# Patient Record
Sex: Female | Born: 1975 | Race: White | Hispanic: No | Marital: Married | Smoking: Never smoker
Health system: Southern US, Community
[De-identification: ages and names within clinical notes are randomized; demographics above are authoritative.]

## PROBLEM LIST (undated history)

## (undated) DIAGNOSIS — Z803 Family history of malignant neoplasm of breast: Secondary | ICD-10-CM

## (undated) DIAGNOSIS — Z1509 Genetic susceptibility to other malignant neoplasm: Secondary | ICD-10-CM

## (undated) DIAGNOSIS — Z1379 Encounter for other screening for genetic and chromosomal anomalies: Principal | ICD-10-CM

## (undated) DIAGNOSIS — Z8041 Family history of malignant neoplasm of ovary: Secondary | ICD-10-CM

## (undated) DIAGNOSIS — Z1501 Genetic susceptibility to malignant neoplasm of breast: Secondary | ICD-10-CM

## (undated) DIAGNOSIS — J189 Pneumonia, unspecified organism: Secondary | ICD-10-CM

## (undated) HISTORY — DX: Family history of malignant neoplasm of ovary: Z80.41

## (undated) HISTORY — DX: Encounter for other screening for genetic and chromosomal anomalies: Z13.79

## (undated) HISTORY — PX: BREAST SURGERY: SHX581

## (undated) HISTORY — PX: DILATION AND CURETTAGE OF UTERUS: SHX78

## (undated) HISTORY — DX: Genetic susceptibility to other malignant neoplasm: Z15.09

## (undated) HISTORY — DX: Genetic susceptibility to malignant neoplasm of breast: Z15.01

## (undated) HISTORY — DX: Family history of malignant neoplasm of breast: Z80.3

---

## 2013-04-10 ENCOUNTER — Encounter (HOSPITAL_COMMUNITY): Payer: Self-pay

## 2013-04-10 ENCOUNTER — Emergency Department (HOSPITAL_COMMUNITY)
Admission: EM | Admit: 2013-04-10 | Discharge: 2013-04-10 | Disposition: A | Discharge status: Home / Self Care | Payer: BC Managed Care – PPO | Attending: Emergency Medicine | Admitting: Emergency Medicine

## 2013-04-10 ENCOUNTER — Emergency Department (HOSPITAL_COMMUNITY): Payer: BC Managed Care – PPO

## 2013-04-10 DIAGNOSIS — S42023A Displaced fracture of shaft of unspecified clavicle, initial encounter for closed fracture: Secondary | ICD-10-CM | POA: Insufficient documentation

## 2013-04-10 DIAGNOSIS — Y929 Unspecified place or not applicable: Secondary | ICD-10-CM | POA: Insufficient documentation

## 2013-04-10 DIAGNOSIS — S42022A Displaced fracture of shaft of left clavicle, initial encounter for closed fracture: Secondary | ICD-10-CM

## 2013-04-10 DIAGNOSIS — Y9389 Activity, other specified: Secondary | ICD-10-CM | POA: Insufficient documentation

## 2013-04-10 DIAGNOSIS — R209 Unspecified disturbances of skin sensation: Secondary | ICD-10-CM | POA: Insufficient documentation

## 2013-04-10 MED ORDER — OXYCODONE-ACETAMINOPHEN 5-325 MG PO TABS: 1.0000 | ORAL_TABLET | Freq: Four times a day (QID) | ORAL | Status: DC | PRN

## 2013-04-10 MED ORDER — MORPHINE SULFATE 4 MG/ML IJ SOLN
4.0000 mg | Freq: Once | INTRAMUSCULAR | Status: AC
  Administered 2013-04-10: 4 mg via INTRAVENOUS
  Filled 2013-04-10: qty 1

## 2013-04-10 NOTE — ED Provider Notes (Signed)
Medical screening examination/treatment/procedure(s) were performed by non-physician practitioner and as supervising physician I was immediately available for consultation/collaboration.   Mirai Greenwood, MD 04/10/13 2204 
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
## 2013-04-10 NOTE — ED Notes (Signed)
Ortho called for sling placement. Ortho aware of pt.

## 2013-04-10 NOTE — ED Notes (Signed)
ems gave 250 mcgs of fentanyl in route

## 2013-06-06 ENCOUNTER — Ambulatory Visit
Admission: RE | Admit: 2013-06-06 | Discharge: 2013-06-06 | Disposition: A | Discharge status: Home / Self Care | Payer: BC Managed Care – PPO | Source: Ambulatory Visit | Attending: Orthopedic Surgery | Admitting: Orthopedic Surgery

## 2013-06-06 ENCOUNTER — Other Ambulatory Visit: Payer: Self-pay | Admitting: Orthopedic Surgery

## 2013-06-06 DIAGNOSIS — R52 Pain, unspecified: Secondary | ICD-10-CM

## 2013-06-06 DIAGNOSIS — R531 Weakness: Secondary | ICD-10-CM

## 2013-06-11 ENCOUNTER — Other Ambulatory Visit (HOSPITAL_COMMUNITY): Payer: Self-pay | Admitting: Orthopedic Surgery

## 2013-06-13 ENCOUNTER — Encounter (HOSPITAL_COMMUNITY): Payer: Self-pay | Admitting: Pharmacy Technician

## 2013-06-14 ENCOUNTER — Encounter (HOSPITAL_COMMUNITY): Payer: Self-pay

## 2013-06-14 ENCOUNTER — Other Ambulatory Visit (HOSPITAL_COMMUNITY): Payer: Self-pay | Admitting: *Deleted

## 2013-06-14 ENCOUNTER — Encounter (HOSPITAL_COMMUNITY)
Admission: RE | Admit: 2013-06-14 | Discharge: 2013-06-14 | Disposition: A | Discharge status: Home / Self Care | Payer: BC Managed Care – PPO | Source: Ambulatory Visit | Attending: Orthopedic Surgery | Admitting: Orthopedic Surgery

## 2013-06-14 DIAGNOSIS — Z01818 Encounter for other preprocedural examination: Secondary | ICD-10-CM | POA: Insufficient documentation

## 2013-06-14 DIAGNOSIS — Z01812 Encounter for preprocedural laboratory examination: Secondary | ICD-10-CM | POA: Insufficient documentation

## 2013-06-14 HISTORY — DX: Pneumonia, unspecified organism: J18.9

## 2013-06-14 LAB — HCG, SERUM, QUALITATIVE: Preg, Serum: NEGATIVE

## 2013-06-14 LAB — CBC
HCT: 37 % (ref 36.0–46.0)
Hemoglobin: 12.5 g/dL (ref 12.0–15.0)
MCH: 33.1 pg (ref 26.0–34.0)
MCHC: 33.8 g/dL (ref 30.0–36.0)
RBC: 3.78 MIL/uL — ABNORMAL LOW (ref 3.87–5.11)

## 2013-06-14 NOTE — Pre-Procedure Instructions (Signed)
Judith Preston  06/14/2013   Your procedure is scheduled on:  Tuesday, June 18, 2013 at 7:30 AM.   Report to [facility] Entrance "A" at 5:30 AM.   Call this number if you have problems the morning of surgery: [phone]   Remember:   Do not eat food or drink liquids after midnight Monday, 06/17/13.   Take these medicines the morning of surgery with A SIP OF WATER: oxyCODONE-acetaminophen (PERCOCET/ROXICET) - if needed, birth control pill  Stop Vitamins as of today, 06/14/13.    Do not wear jewelry, make-up or nail polish.  Do not wear lotions, powders, or perfumes. You may wear deodorant.  Do not shave 48 hours prior to surgery.   Do not bring valuables to the hospital.  [facility] is not responsible                  for any belongings or valuables.               Contacts, dentures or bridgework may not be worn into surgery.  Leave suitcase in the car. After surgery it may be brought to your room.  For patients admitted to the hospital, discharge time is determined by your                treatment team.               Patients discharged the day of surgery will not be allowed to drive  home.  Name and phone number of your driver: Family/friend   Special Instructions: Shower using CHG 2 nights before surgery and the night before surgery.  If you shower the day of surgery use CHG.  Use special wash - you have one bottle of CHG for all showers.  You should use approximately 1/3 of the bottle for each shower.   Please read over the following fact sheets that you were given: Pain Booklet, Coughing and Deep Breathing and Surgical Site Infection Prevention
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
## 2013-06-18 ENCOUNTER — Ambulatory Visit (HOSPITAL_COMMUNITY): Payer: BC Managed Care – PPO

## 2013-06-18 ENCOUNTER — Ambulatory Visit (HOSPITAL_COMMUNITY)
Admission: RE | Admit: 2013-06-18 | Discharge: 2013-06-18 | Disposition: A | Discharge status: Home / Self Care | Payer: BC Managed Care – PPO | Source: Ambulatory Visit | Attending: Orthopedic Surgery | Admitting: Orthopedic Surgery

## 2013-06-18 ENCOUNTER — Encounter (HOSPITAL_COMMUNITY): Payer: Self-pay | Admitting: *Deleted

## 2013-06-18 ENCOUNTER — Encounter (HOSPITAL_COMMUNITY)
Admission: RE | Disposition: A | Discharge status: Home / Self Care | Payer: Self-pay | Source: Ambulatory Visit | Attending: Orthopedic Surgery

## 2013-06-18 ENCOUNTER — Encounter (HOSPITAL_COMMUNITY): Payer: BC Managed Care – PPO | Admitting: Anesthesiology

## 2013-06-18 ENCOUNTER — Ambulatory Visit (HOSPITAL_COMMUNITY): Payer: BC Managed Care – PPO | Admitting: Anesthesiology

## 2013-06-18 DIAGNOSIS — S42002G Fracture of unspecified part of left clavicle, subsequent encounter for fracture with delayed healing: Secondary | ICD-10-CM

## 2013-06-18 DIAGNOSIS — IMO0002 Reserved for concepts with insufficient information to code with codable children: Secondary | ICD-10-CM | POA: Insufficient documentation

## 2013-06-18 HISTORY — PX: ORIF CLAVICULAR FRACTURE: SHX5055

## 2013-06-18 SURGERY — OPEN REDUCTION INTERNAL FIXATION (ORIF) CLAVICULAR FRACTURE
Anesthesia: General | Site: Shoulder | Laterality: Left | Wound class: Clean

## 2013-06-18 MED ORDER — METHOCARBAMOL 500 MG PO TABS: 500.0000 mg | ORAL_TABLET | Freq: Four times a day (QID) | ORAL | Status: AC

## 2013-06-18 MED ORDER — OXYCODONE HCL 5 MG/5ML PO SOLN: 5.0000 mg | Freq: Once | ORAL | Status: DC | PRN

## 2013-06-18 MED ORDER — MIDAZOLAM HCL 5 MG/5ML IJ SOLN
INTRAMUSCULAR | Status: DC | PRN
  Administered 2013-06-18: 2 mg via INTRAVENOUS

## 2013-06-18 MED ORDER — LACTATED RINGERS IV SOLN
INTRAVENOUS | Status: DC | PRN
  Administered 2013-06-18 (×2): via INTRAVENOUS

## 2013-06-18 MED ORDER — OXYCODONE-ACETAMINOPHEN 5-325 MG PO TABS
ORAL_TABLET | ORAL | Status: AC
  Administered 2013-06-18: 2 via ORAL
  Filled 2013-06-18: qty 2

## 2013-06-18 MED ORDER — ARTIFICIAL TEARS OP OINT
TOPICAL_OINTMENT | OPHTHALMIC | Status: DC | PRN
  Administered 2013-06-18: 1 via OPHTHALMIC

## 2013-06-18 MED ORDER — LIDOCAINE HCL (CARDIAC) 20 MG/ML IV SOLN
INTRAVENOUS | Status: DC | PRN
  Administered 2013-06-18: 70 mg via INTRAVENOUS

## 2013-06-18 MED ORDER — PROPOFOL 10 MG/ML IV BOLUS
INTRAVENOUS | Status: DC | PRN
  Administered 2013-06-18: 150 mg via INTRAVENOUS

## 2013-06-18 MED ORDER — NEOSTIGMINE METHYLSULFATE 1 MG/ML IJ SOLN
INTRAMUSCULAR | Status: DC | PRN
  Administered 2013-06-18: 4 mg via INTRAVENOUS

## 2013-06-18 MED ORDER — CEFAZOLIN SODIUM-DEXTROSE 2-3 GM-% IV SOLR
INTRAVENOUS | Status: AC
  Administered 2013-06-18: 2 g via INTRAVENOUS
  Filled 2013-06-18: qty 50

## 2013-06-18 MED ORDER — METHOCARBAMOL 500 MG PO TABS
ORAL_TABLET | ORAL | Status: AC
  Administered 2013-06-18: 500 mg via ORAL
  Filled 2013-06-18: qty 1

## 2013-06-18 MED ORDER — BUPIVACAINE HCL (PF) 0.25 % IJ SOLN
INTRAMUSCULAR | Status: AC
  Filled 2013-06-18: qty 30

## 2013-06-18 MED ORDER — OXYCODONE-ACETAMINOPHEN 5-325 MG PO TABS
1.0000 | ORAL_TABLET | Freq: Once | ORAL | Status: AC
  Administered 2013-06-18: 2 via ORAL

## 2013-06-18 MED ORDER — METHOCARBAMOL 500 MG PO TABS
500.0000 mg | ORAL_TABLET | Freq: Once | ORAL | Status: AC
  Administered 2013-06-18: 500 mg via ORAL

## 2013-06-18 MED ORDER — ONDANSETRON HCL 4 MG/2ML IJ SOLN
INTRAMUSCULAR | Status: DC | PRN
  Administered 2013-06-18: 4 mg via INTRAVENOUS

## 2013-06-18 MED ORDER — GLYCOPYRROLATE 0.2 MG/ML IJ SOLN
INTRAMUSCULAR | Status: DC | PRN
  Administered 2013-06-18: .6 mg via INTRAVENOUS

## 2013-06-18 MED ORDER — PROMETHAZINE HCL 25 MG/ML IJ SOLN: 6.2500 mg | INTRAMUSCULAR | Status: DC | PRN

## 2013-06-18 MED ORDER — BUPIVACAINE HCL (PF) 0.25 % IJ SOLN
INTRAMUSCULAR | Status: DC | PRN
  Administered 2013-06-18: 20 mL

## 2013-06-18 MED ORDER — HYDROMORPHONE HCL PF 1 MG/ML IJ SOLN
0.2500 mg | INTRAMUSCULAR | Status: DC | PRN
  Administered 2013-06-18 (×4): 0.5 mg via INTRAVENOUS

## 2013-06-18 MED ORDER — FENTANYL CITRATE 0.05 MG/ML IJ SOLN
INTRAMUSCULAR | Status: DC | PRN
  Administered 2013-06-18 (×2): 100 ug via INTRAVENOUS
  Administered 2013-06-18: 50 ug via INTRAVENOUS

## 2013-06-18 MED ORDER — HYDROMORPHONE HCL PF 1 MG/ML IJ SOLN
INTRAMUSCULAR | Status: AC
  Administered 2013-06-18: 0.5 mg via INTRAVENOUS
  Filled 2013-06-18: qty 1

## 2013-06-18 MED ORDER — OXYCODONE-ACETAMINOPHEN 10-325 MG PO TABS: 1.0000 | ORAL_TABLET | Freq: Four times a day (QID) | ORAL | Status: AC | PRN

## 2013-06-18 MED ORDER — ROCURONIUM BROMIDE 100 MG/10ML IV SOLN
INTRAVENOUS | Status: DC | PRN
  Administered 2013-06-18: 50 mg via INTRAVENOUS
  Administered 2013-06-18: 20 mg via INTRAVENOUS

## 2013-06-18 MED ORDER — PROMETHAZINE HCL 25 MG PO TABS
25.0000 mg | ORAL_TABLET | Freq: Once | ORAL | Status: DC
Start: 2013-06-18 — End: 2013-06-18
  Filled 2013-06-18: qty 1

## 2013-06-18 MED ORDER — OXYCODONE HCL 5 MG PO TABS: 5.0000 mg | ORAL_TABLET | Freq: Once | ORAL | Status: DC | PRN

## 2013-06-18 MED ORDER — DEXAMETHASONE SODIUM PHOSPHATE 4 MG/ML IJ SOLN
INTRAMUSCULAR | Status: DC | PRN
  Administered 2013-06-18: 8 mg via INTRAVENOUS

## 2013-06-18 SURGICAL SUPPLY — 54 items
BENZOIN TINCTURE PRP APPL 2/3 (GAUZE/BANDAGES/DRESSINGS) ×2 IMPLANT
BIT DRILL 2.5 CANN ENDOSCOPIC (BIT) ×2 IMPLANT
BONE MATRIX DEMINERALIZED 1CC (Bone Implant) ×2 IMPLANT
CLOTH BEACON ORANGE TIMEOUT ST (SAFETY) ×2 IMPLANT
CLSR STERI-STRIP ANTIMIC 1/2X4 (GAUZE/BANDAGES/DRESSINGS) ×2 IMPLANT
COVER SURGICAL LIGHT HANDLE (MISCELLANEOUS) ×2 IMPLANT
DRAIN PENROSE 1/2X12 LTX STRL (WOUND CARE) IMPLANT
DRAPE C-ARM 42X72 X-RAY (DRAPES) ×2 IMPLANT
DRAPE INCISE IOBAN 66X45 STRL (DRAPES) ×4 IMPLANT
DRAPE U-SHAPE 47X51 STRL (DRAPES) ×4 IMPLANT
DRSG MEPILEX BORDER 4X12 (GAUZE/BANDAGES/DRESSINGS) IMPLANT
DRSG MEPILEX BORDER 4X8 (GAUZE/BANDAGES/DRESSINGS) ×2 IMPLANT
DRSG PAD ABDOMINAL 8X10 ST (GAUZE/BANDAGES/DRESSINGS) ×2 IMPLANT
DURAPREP 26ML APPLICATOR (WOUND CARE) ×2 IMPLANT
ELECT REM PT RETURN 9FT ADLT (ELECTROSURGICAL) ×2
ELECTRODE REM PT RTRN 9FT ADLT (ELECTROSURGICAL) ×1 IMPLANT
FACESHIELD LNG OPTICON STERILE (SAFETY) ×2 IMPLANT
GAUZE XEROFORM 5X9 LF (GAUZE/BANDAGES/DRESSINGS) IMPLANT
GLOVE BIO SURGEON ST LM GN SZ9 (GLOVE) ×2 IMPLANT
GLOVE BIOGEL PI IND STRL 8 (GLOVE) ×1 IMPLANT
GLOVE BIOGEL PI INDICATOR 8 (GLOVE) ×1
GLOVE SURG ORTHO 8.0 STRL STRW (GLOVE) ×2 IMPLANT
GOWN PREVENTION PLUS LG XLONG (DISPOSABLE) ×2 IMPLANT
GOWN PREVENTION PLUS XLARGE (GOWN DISPOSABLE) ×2 IMPLANT
GOWN STRL NON-REIN LRG LVL3 (GOWN DISPOSABLE) ×4 IMPLANT
KIT BASIN OR (CUSTOM PROCEDURE TRAY) ×2 IMPLANT
KIT ROOM TURNOVER OR (KITS) ×2 IMPLANT
MANIFOLD NEPTUNE II (INSTRUMENTS) ×2 IMPLANT
NEEDLE 21X1 OR PACK (NEEDLE) ×2 IMPLANT
NS IRRIG 1000ML POUR BTL (IV SOLUTION) ×2 IMPLANT
PACK SHOULDER (CUSTOM PROCEDURE TRAY) ×2 IMPLANT
PAD ARMBOARD 7.5X6 YLW CONV (MISCELLANEOUS) ×4 IMPLANT
PENCIL BUTTON HOLSTER BLD 10FT (ELECTRODE) IMPLANT
PLATE CLAVICLE 7H LEFT LOCKING (Plate) ×2 IMPLANT
SCREW LOCK T15 FT 10X3.5XST (Screw) ×2 IMPLANT
SCREW LOCKING 3.5X10 (Screw) ×2 IMPLANT
SCREW LOW PROFILE 3.5X14 (Screw) ×6 IMPLANT
SCREW NON-LOCKING 3.5X12MM (Screw) ×4 IMPLANT
SLING ARM IMMOBILIZER LRG (SOFTGOODS) IMPLANT
SLING ARM IMMOBILIZER MED (SOFTGOODS) ×2 IMPLANT
SPONGE GAUZE 4X4 12PLY (GAUZE/BANDAGES/DRESSINGS) IMPLANT
SPONGE LAP 4X18 X RAY DECT (DISPOSABLE) ×4 IMPLANT
STAPLER VISISTAT 35W (STAPLE) IMPLANT
SUCTION FRAZIER TIP 10 FR DISP (SUCTIONS) IMPLANT
SUT PROLENE 3 0 PS 2 (SUTURE) ×2 IMPLANT
SUT VIC AB 0 CT1 27 (SUTURE) ×2
SUT VIC AB 0 CT1 27XBRD ANBCTR (SUTURE) ×2 IMPLANT
SUT VIC AB 2-0 CTB1 (SUTURE) ×4 IMPLANT
SYR CONTROL 10ML LL (SYRINGE) ×2 IMPLANT
TOWEL OR 17X24 6PK STRL BLUE (TOWEL DISPOSABLE) ×2 IMPLANT
TOWEL OR 17X26 10 PK STRL BLUE (TOWEL DISPOSABLE) ×2 IMPLANT
TUBE CONNECTING 12X1/4 (SUCTIONS) IMPLANT
WATER STERILE IRR 1000ML POUR (IV SOLUTION) ×2 IMPLANT
YANKAUER SUCT BULB TIP NO VENT (SUCTIONS) IMPLANT

## 2013-06-18 NOTE — Preoperative (Signed)
Beta Blockers   Reason not to administer Beta Blockers:Not Applicable 

## 2013-06-18 NOTE — Anesthesia Procedure Notes (Signed)
Procedure Name: Intubation Date/Time: 06/18/2013 7:38 AM Performed by: Tyrone Nine Pre-anesthesia Checklist: Patient identified, Timeout performed, Emergency Drugs available, Suction available and Patient being monitored Patient Re-evaluated:Patient Re-evaluated prior to inductionOxygen Delivery Method: Circle system utilized Preoxygenation: Pre-oxygenation with 100% oxygen Intubation Type: IV induction Ventilation: Mask ventilation without difficulty Laryngoscope Size: Mac and 3 Grade View: Grade I Tube type: Oral Tube size: 7.5 mm Number of attempts: 1 Airway Equipment and Method: Stylet Placement Confirmation: ETT inserted through vocal cords under direct vision,  positive ETCO2 and breath sounds checked- equal and bilateral Secured at: 22 cm Tube secured with: Tape Dental Injury: Teeth and Oropharynx as per pre-operative assessment

## 2013-06-18 NOTE — Anesthesia Postprocedure Evaluation (Signed)
Anesthesia Post Note  Patient: Judith Preston  Procedure(s) Performed: Procedure(s) (LRB): OPEN REDUCTION INTERNAL FIXATION (ORIF) CLAVICULAR FRACTURE (Left)  Anesthesia type: general  Patient location: PACU  Post pain: Pain level controlled  Post assessment: Patient's Cardiovascular Status Stable  Last Vitals:  Filed Vitals:   06/18/13 1100  BP: 112/72  Pulse: 50  Temp:   Resp: 14    Post vital signs: Reviewed and stable  Level of consciousness: sedated  Complications: No apparent anesthesia complications

## 2013-06-18 NOTE — Interval H&P Note (Signed)
History and Physical Interval Note:  06/18/2013 7:26 AM  Judith Preston  has presented today for surgery, with the diagnosis of LEFT CLAVICLE NONUNION  The various methods of treatment have been discussed with the patient and family. After consideration of risks, benefits and other options for treatment, the patient has consented to  Procedure(s) with comments: OPEN REDUCTION INTERNAL FIXATION (ORIF) CLAVICULAR FRACTURE (Left) - LEFT CLAVICLE FRACTURE FIXATION. as a surgical intervention .  The patient's history has been reviewed, patient examined, no change in status, stable for surgery.  I have reviewed the patient's chart and labs.  Questions were answered to the patient's satisfaction.     DEAN,GREGORY SCOTT

## 2013-06-18 NOTE — Brief Op Note (Signed)
06/18/2013  10:06 AM  PATIENT:  Arbutus Leas  37 y.o. female  PRE-OPERATIVE DIAGNOSIS:  LEFT CLAVICLE NONUNION  POST-OPERATIVE DIAGNOSIS:  LEFT CLAVICLE NONUNION  PROCEDURE:  Procedure(s): OPEN REDUCTION INTERNAL FIXATION (ORIF) CLAVICULAR FRACTURE  SURGEON:  Surgeon(s): Cammy Copa, MD  ASSISTANT: April green rnfa  ANESTHESIA:   general  EBL: 50 ml    Total I/O In: 1000 [I.V.:1000] Out: 30 [Blood:30]  BLOOD ADMINISTERED: none  DRAINS: none   LOCAL MEDICATIONS USED:  none  SPECIMEN:  No Specimen  COUNTS:  YES  TOURNIQUET:  * No tourniquets in log *  DICTATION: .Other Dictation: Dictation Number [id number]  PLAN OF CARE: Discharge to home after PACU  PATIENT DISPOSITION:  PACU - hemodynamically stable

## 2013-06-18 NOTE — Anesthesia Preprocedure Evaluation (Addendum)
Anesthesia Evaluation  Patient identified by MRN, date of birth, ID band Patient awake    Reviewed: Allergy & Precautions, H&P , NPO status , Patient's Chart, lab work & pertinent test results  Airway Mallampati: I TM Distance: >3 FB Neck ROM: Full    Dental  (+) Teeth Intact and Dental Advisory Given   Pulmonary neg pulmonary ROS, neg pneumonia -,  Last event was 1 yr ago breath sounds clear to auscultation  Pulmonary exam normal       Cardiovascular Exercise Tolerance: Good negative cardio ROS  Rhythm:regular Rate:Normal     Neuro/Psych negative neurological ROS  negative psych ROS   GI/Hepatic negative GI ROS, Neg liver ROS,   Endo/Other  negative endocrine ROS  Renal/GU negative Renal ROS     Musculoskeletal negative musculoskeletal ROS (+)   Abdominal Normal abdominal exam  (+)   Peds  Hematology   Anesthesia Other Findings   Reproductive/Obstetrics negative OB ROS                       Anesthesia Physical Anesthesia Plan  ASA: I  Anesthesia Plan: General   Post-op Pain Management:    Induction: Intravenous  Airway Management Planned: Oral ETT  Additional Equipment:   Intra-op Plan:   Post-operative Plan: Extubation in OR  Informed Consent: I have reviewed the patients History and Physical, chart, labs and discussed the procedure including the risks, benefits and alternatives for the proposed anesthesia with the patient or authorized representative who has indicated his/her understanding and acceptance.   Dental advisory given  Plan Discussed with: CRNA, Anesthesiologist and Surgeon  Anesthesia Plan Comments:        Anesthesia Quick Evaluation

## 2013-06-18 NOTE — Op Note (Signed)
NAMERABAB, CURRINGTON NO.:  [id number]  MEDICAL RECORD NO.:  [id number]  LOCATION:  [facility]                         FACILITY:  [facility]  PHYSICIAN:  Burnard Bunting, M.D.    DATE OF BIRTH:  [birth year]  DATE OF PROCEDURE: DATE OF DISCHARGE:                              OPERATIVE REPORT   PREOPERATIVE DIAGNOSIS:  Left shoulder displaced clavicle fracture, nonunion.  POSTOPERATIVE DIAGNOSIS:  Left shoulder displaced clavicle fracture, nonunion.  PROCEDURE:  Left shoulder open reduction and internal fixation, clavicle fracture using Arthrex 6-hole clavicle plate with StimuBlast bone graft.  SURGEON:  Burnard Bunting, M.D.  ASSISTANT:  April Neva Seat, RNFA.  INDICATIONS:  Yashvi is a patient who is 2 months out from left shoulder clavicle fracture.  She has gone onto delayed union with no callus formation present at 2 months, presents now for operative management after explanation of risks and benefits.  PROCEDURE IN DETAIL:  The patient was brought to operating room where general endotracheal anesthesia induced.  Preoperative antibiotics administered.  Time-out was called.  The patient was placed in a beach- chair position with the head in neutral position.  Left arm and shoulder prescrubbed with alcohol and Betadine, which was allowed to air dry, prepped for DuraPrep solution and draped in sterile manner.  Collier Flowers was used to cover the operative field including the axilla.  Incisions made over the clavicle skin and subcu tissue sharply divided.  Care was taken to avoid crossing of dermal nerves when possible.  Subperiosteal elevation was performed on both ends of the clavicle with care being taken to avoid injury to underlying neurovascular structures.  The exposed bony surfaces of the clavicle were scraped.  The clavicle itself was quite small.  A 7-hole plate was applied and fixation was achieved, 6 cortices on either side.  One locking screw was placed because  of unicortical fragment fixation.  Good restoration of length was achieved. Under fluoroscopic guidance, screw lengths looked good.  Thorough irrigation was performed with 3 L of irrigating solution and StimuBlast was placed around the fracture site.  Incision was then closed in layers using 0 Vicryl suture, 2-0 Vicryl suture, and running 3-0 pullout Prolene and anesthetic was used in the skin edges.  The patient tolerated the procedure well without immediate complication.  Dressing and sling applied.  Plan is for discharge to home.     Burnard Bunting, M.D.     GSD/MEDQ  D:  06/18/2013  T:  06/18/2013  Job:  [id number]

## 2013-06-18 NOTE — Transfer of Care (Signed)
Immediate Anesthesia Transfer of Care Note  Patient: Judith Preston  Procedure(s) Performed: Procedure(s) with comments: OPEN REDUCTION INTERNAL FIXATION (ORIF) CLAVICULAR FRACTURE (Left) - LEFT CLAVICLE FRACTURE FIXATION.  Patient Location: PACU  Anesthesia Type:General  Level of Consciousness: awake, alert , oriented and patient cooperative  Airway & Oxygen Therapy: Patient Spontanous Breathing and Patient connected to nasal cannula oxygen  Post-op Assessment: Report given to PACU RN and Post -op Vital signs reviewed and stable  Post vital signs: Reviewed and stable  Complications: No apparent anesthesia complications

## 2013-06-25 ENCOUNTER — Encounter (HOSPITAL_COMMUNITY): Payer: Self-pay | Admitting: Orthopedic Surgery

## 2013-07-23 ENCOUNTER — Encounter (HOSPITAL_COMMUNITY): Payer: Self-pay | Admitting: Orthopedic Surgery

## 2017-09-11 ENCOUNTER — Encounter: Payer: Self-pay | Admitting: Genetics

## 2017-09-22 ENCOUNTER — Inpatient Hospital Stay: Payer: 59

## 2017-09-22 ENCOUNTER — Inpatient Hospital Stay: Payer: 59 | Attending: Genetic Counselor | Admitting: Genetic Counselor

## 2017-09-22 ENCOUNTER — Encounter: Payer: Self-pay | Admitting: Genetic Counselor

## 2017-09-22 DIAGNOSIS — Z803 Family history of malignant neoplasm of breast: Secondary | ICD-10-CM

## 2017-09-22 DIAGNOSIS — Z1379 Encounter for other screening for genetic and chromosomal anomalies: Secondary | ICD-10-CM

## 2017-09-22 DIAGNOSIS — Z8041 Family history of malignant neoplasm of ovary: Secondary | ICD-10-CM | POA: Diagnosis not present

## 2017-09-22 NOTE — Progress Notes (Signed)
[facility]      Initial Visit   Patient Name: Judith Preston Patient DOB: 1975/09/19 Patient Age: 42 y.o. Encounter Date: 09/22/2017  Referring Provider: Geraldo Pitter, MD  Primary Care Provider: System, Pcp Not In  Reason for Visit: Evaluate for hereditary susceptibility to cancer    Assessment and Plan:  . Judith Preston has a family history of breast and ovarian cancers and her aunt was recently found to have a pathogenic BRCA2 mutation. Her father has not agreed to get tested. She understood that she has up to a 50% chance she has this mutation if her father has it.  . Testing is recommended to determine whether she has the BRCA2 or any other pathogenic mutation that will impact her screening and risk-reduction for cancer. A negative result will be reassuring.  . Judith Preston wished to pursue genetic testing and a blood sample will be sent for analysis of the 23 genes on [facility]'s Breast/GYN panel (ATM, BARD1, BRCA1, BRCA2, BRIP1, CDH1, CHEK2, DICER1, EPCAM, MLH1,  MSH2, MSH6, NBN, NF1, PALB2, PMS2, PTEN, RAD50, RAD51C, RAD51D,SMARCA4, STK11, and TP53).   Marland Kitchen Results should be available in approximately 2-4 weeks, at which point we will contact her and address implications for her as well as address genetic testing for at-risk family members, if needed.     Dr. Jana Hakim was available for questions concerning this case. Total time spent by me in face-to-face counseling was approximately 30 minutes.   _____________________________________________________________________   History of Present Illness: Judith Preston, a 42 y.o. female, is being seen at the [facility] due to a family history of cancer and a pathogenic BRCA2 mutation in her paternal aunt. She presents to clinic today to discuss implications of this for her and to pursue genetic testing.  Judith Preston has no personal history of cancer.  Her aunt with breast  cancer had genetic testing about a year ago and was found to have a pathogenic BRCA2 mutation called c.[id number] ([id number]). Her testing was done through [facility] and multiple genes were analyzed.  Past Medical History:  Diagnosis Date  . Family history of breast cancer    paternal aunt with BRCA2 mutation  . Family history of ovarian cancer   . Pneumonia    2012 and 2013    Past Surgical History:  Procedure Laterality Date  . BREAST SURGERY     augmentation  . DILATION AND CURETTAGE OF UTERUS    . ORIF CLAVICULAR FRACTURE Left 06/18/2013   Procedure: OPEN REDUCTION INTERNAL FIXATION (ORIF) CLAVICULAR FRACTURE;  Surgeon: Meredith Pel, MD;  Location: [facility];  Service: Orthopedics;  Laterality: Left;  LEFT CLAVICLE FRACTURE FIXATION.    Social History   Socioeconomic History  . Marital status: Married    Spouse name: Not on file  . Number of children: Not on file  . Years of education: Not on file  . Highest education level: Not on file  Social Needs  . Financial resource strain: Not on file  . Food insecurity - worry: Not on file  . Food insecurity - inability: Not on file  . Transportation needs - medical: Not on file  . Transportation needs - non-medical: Not on file  Occupational History  . Not on file  Tobacco Use  . Smoking status: Never Smoker  . Smokeless tobacco: Never Used  Substance and Sexual Activity  . Alcohol use: No  . Drug use: No  . Sexual  activity: Not on file  Other Topics Concern  . Not on file  Social History Narrative  . Not on file     Family History:  During the visit, a 4-generation pedigree was obtained. Family tree will be scanned in the Media tab in Epic  Significant diagnoses include the following:  Family History  Problem Relation Age of Onset  . Breast cancer Paternal Aunt 4       currently 29  . BRCA 1/2 Paternal Aunt        BRCA2 mutation c.4600dupA [id number])  . Ovarian cancer Paternal Grandmother 30        deceased 59    Additionally, Judith Preston has a son (age 87), a daughter (age 15) and twin son and daughter (age 12.5 months). She has one sister (age 83) who has no children. She has no yet initiated genetic testing. Her mother (age 77) is cancer-free. Her mother has 7 sisters and 2 brothers. Her father (age 42) has 2 sisters (one noted above).  Judith Preston's ancestry is Caucasian - NOS. There is no known Jewish ancestry and no consanguinity.  Discussion: We reviewed the characteristics, features and inheritance patterns of hereditary cancer syndromes with a focus on the BRCA2 gene and its associated cancer risks. We discussed her risk of harboring a mutation in the context of her personal and family history. We discussed the process of genetic testing, insurance coverage and implications of results: positive, negative and variant of unknown significance (VUS).    Judith Preston's questions were answered to her satisfaction today and she is welcome to call with any additional questions or concerns. Thank you for the referral and allowing [place] to share in the care of your patient.    Steele Berg, MS, [place] Certified Genetic Counselor phone: [phone] [email or web address]

## 2017-10-03 ENCOUNTER — Ambulatory Visit: Payer: Self-pay | Admitting: Genetic Counselor

## 2017-10-03 ENCOUNTER — Encounter: Payer: Self-pay | Admitting: Genetic Counselor

## 2017-10-03 DIAGNOSIS — Z1501 Genetic susceptibility to malignant neoplasm of breast: Secondary | ICD-10-CM

## 2017-10-03 DIAGNOSIS — Z1509 Genetic susceptibility to other malignant neoplasm: Secondary | ICD-10-CM

## 2017-10-03 DIAGNOSIS — Z1379 Encounter for other screening for genetic and chromosomal anomalies: Secondary | ICD-10-CM

## 2017-10-03 HISTORY — DX: Encounter for other screening for genetic and chromosomal anomalies: Z13.79

## 2017-10-03 HISTORY — DX: Genetic susceptibility to malignant neoplasm of breast: Z15.01

## 2017-10-03 NOTE — Progress Notes (Signed)
[facility]         Results Disclosure   Patient Name: Leafy Motsinger Patient DOB: 31-Jul-1975 Patient Age: 42 y.o. Encounter Date: 10/03/2017  Referring Provider: Azell Der, MD  Reason for Call: Discuss results of genetic testing   Ms. Passe was seen in the [facility] on 09/22/2017 due to a family history of cancer and a BRCA2 mutation in her paternal aunt. Please refer to the prior Genetics clinic note for more information regarding Ms. Minarik's medical and family histories and our assessment at the time.   Genetic Testing: At the time of Ms. Clinkenbeard's visit, she chose to pursue genetic testing of multiple genes associated with hereditary susceptibility to cancer.  Testing included sequencing and deletion/duplication analysis. 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  A copy of the genetic test report will be scanned into Epic under the Media tab.  The genes that were analyzed were the 23 genes on [facility]'s Breast/GYN panel (ATM, BARD1, BRCA1, BRCA2, BRIP1, CDH1, CHEK2, DICER1, EPCAM, MLH1,  MSH2, MSH6, NBN, NF1, PALB2, PMS2, PTEN, RAD50, RAD51C, RAD51D,SMARCA4, STK11, and TP53).  BRCA2-Related Cancer Risks: Women with a BRCA2 mutation have a 40-85% lifetime risk of developing breast cancer, compared to the general population's risk of 12%. The risk of contralateral breast cancer after 5 years of initial diagnosis is about 10%. They also have an increased risk of developing ovarian cancer (20-44%), which is significantly higher than the general population's risk (1-2%). Risks for other types of cancer, including prostate cancer, pancreatic cancer, female breast cancer and melanoma, are increased as well.   Medical Management: Ms. Pund is at increased risk of cancer and we, therefore, discussed the following recommendations from the NCCN (Genetic/Familial High-Risk Assessment: Breast  and Ovarian V3.2019) that are specific to women with a BRCA2 mutation. Because the NCCN updates these guidelines periodically, clinicians are recommended to view the most recent guidelines at https://www.martin.info/.   Breast Cancer:  - Starting at age 41: Breast awareness - Women should be familiar with their breasts and promptly report changes to their healthcare provider. Performing regular breast self exams may help increase breast awareness, especially when checked at the end of the menstrual cycle.  - Starting at age 74: Clinical breast exam every 6-12 months. - Between ages 81-29: Breast MRI screening with contrast (preferred) every year or mammogram with consideration of tomosynthesis if MRI is unavailable; or individualized based on family history if a breast cancer diagnosis before age 21 is present. - Between ages 70-75: Mammogram with consideration of tomosynthesis and breast MRI screening with contrast every year.  - After age 41: Management should be considered on an individualized basis. - Option of risk-reducing bilateral mastectomies. - Consider risk reduction agents as options for breast cancer, including discussing risks and benefits.  Ovarian Cancer: - Recommend risk-reducing salpingo-oophorectomy (RRSO), 64 and 45y, and upon completion of child bearing, unless age at diagnosis in the family warrants earlier age for consideration of this surgery.  - While there may be circumstances where ovarian cancer screening with transvaginal ultrasound and a blood test for a protein called CA-125 are helpful, these techniques have not been shown to be effective in detecting early ovarian cancer and are generally not recommended. - Consider risk reduction agents as options for ovarian cancer, including discussing risks and benefits.  Other Cancers: We discussed that while literature has shown other cancers, such as pancreatic cancer and melanoma,  to be associated with BRCA2 mutations, national  guidelines do not currently recommend any specific screenings for these cancers. Screening may be individualized based on the family history.   Family Members: It is important that all of Ms. Krotzer's relatives (both men and women) know of the presence of this gene mutation. Genetic testing can sort out who in the family is at risk and who is not. We discussed that her father is an obligate carrier of this mutation and that her sister has a 50% chance of having inherited it.  Each of Ms. Bisig's children has a 50% chance to have inherited this mutation. However, they are very young and this will not be of any consequence to them for several years. We do not test children because there is no risk to them until they are adults. We recommend they have genetic counseling and testing by the time that they are in their early 20s.    If a child inherits two BRCA2 mutations, one from each parent, they would have a rare genetic condition called Fanconi Anemia. For this reason, if anyone in the family who has this BRCA2 mutation has young children or is considering having children, they are recommended to have their partner tested. We did discuss testing Ms. Norgard's husband as she has 10 month-old twin sons.  Support and Resources: If Ms. Boursiquot is interested in BRCA-specific information and support, there are two groups, Facing Our Risk (www.facingourrisk.com) and Bright Pink (www.brightpink.org) which some people have found useful. They provide opportunities to speak with other individuals from high-risk families. To locate genetic counselors in other cities, individuals can visit the website of the [employer] (ArtistMovie.se) and Secretary/administrator for a Social worker by zip code.  We encouraged Ms. Bennick to remain in contact with [place] on an annual basis so we can update her personal and family histories, and let her know of advances in cancer genetics that may benefit the family. Our  contact number was provided and she knows she is welcome to call anytime with additional questions.    Steele Berg, MS, [place] Certified Genetic Counselor phone: [phone]

## 2017-10-23 ENCOUNTER — Other Ambulatory Visit: Payer: Self-pay

## 2017-10-23 ENCOUNTER — Encounter: Payer: Self-pay | Admitting: Genetics

## 2020-04-09 ENCOUNTER — Other Ambulatory Visit: Payer: Self-pay

## 2020-04-09 ENCOUNTER — Ambulatory Visit: Payer: 59 | Admitting: Dermatology

## 2020-04-09 DIAGNOSIS — Z1501 Genetic susceptibility to malignant neoplasm of breast: Secondary | ICD-10-CM

## 2020-04-09 DIAGNOSIS — S50861A Insect bite (nonvenomous) of right forearm, initial encounter: Secondary | ICD-10-CM | POA: Diagnosis not present

## 2020-04-09 DIAGNOSIS — Z1509 Genetic susceptibility to other malignant neoplasm: Secondary | ICD-10-CM

## 2020-04-09 DIAGNOSIS — Z1502 Genetic susceptibility to malignant neoplasm of ovary: Secondary | ICD-10-CM

## 2020-04-09 DIAGNOSIS — L814 Other melanin hyperpigmentation: Secondary | ICD-10-CM

## 2020-04-09 DIAGNOSIS — W57XXXA Bitten or stung by nonvenomous insect and other nonvenomous arthropods, initial encounter: Secondary | ICD-10-CM

## 2020-04-09 DIAGNOSIS — D229 Melanocytic nevi, unspecified: Secondary | ICD-10-CM

## 2020-04-09 DIAGNOSIS — L578 Other skin changes due to chronic exposure to nonionizing radiation: Secondary | ICD-10-CM

## 2020-04-09 NOTE — Patient Instructions (Addendum)
Melanoma ABCDEs  Melanoma is the most dangerous type of skin cancer, and is the leading cause of death from skin disease.  You are more likely to develop melanoma if you:  Have light-colored skin, light-colored eyes, or red or blond hair  Spend a lot of time in the sun  Tan regularly, either outdoors or in a tanning bed  Have had blistering sunburns, especially during childhood  Have a close family member who has had a melanoma  Have atypical moles or large birthmarks  Early detection of melanoma is key since treatment is typically straightforward and cure rates are extremely high if we catch it early.   The first sign of melanoma is often a change in a mole or a new dark spot.  The ABCDE system is a way of remembering the signs of melanoma.  A for asymmetry:  The two halves do not match. B for border:  The edges of the growth are irregular. C for color:  A mixture of colors are present instead of an even brown color. D for diameter:  Melanomas are usually (but not always) greater than 6mm - the size of a pencil eraser. E for evolution:  The spot keeps changing in size, shape, and color.  Please check your skin once per month between visits. You can use a small mirror in front and a large mirror behind you to keep an eye on the back side or your body.   If you see any new or changing lesions before your next follow-up, please call to schedule a visit.  Please continue daily skin protection including broad spectrum sunscreen SPF 30+ to sun-exposed areas, reapplying every 2 hours as needed when you're outdoors.    Recommend taking Heliocare sun protection supplement daily in sunny weather for additional sun protection. For maximum protection on the sunniest days, you can take up to 2 capsules of regular Heliocare OR take 1 capsule of Heliocare Ultra. For prolonged exposure (such as a full day in the sun), you can repeat your dose of the supplement 4 hours after your first dose.  Heliocare can be purchased at [facility] or at www.heliocare.[email or web address].   

## 2020-04-09 NOTE — Progress Notes (Signed)
   Follow-Up Visit   Subjective  Judith Preston is a 44 y.o. female who presents for the following: spots (Patient has a few spots at arms that she would like checked. Became bothersome about 1 month or 2 ago. She picks at and they bleed. ).  No personal history of skin cancer.  The following portions of the chart were reviewed this encounter and updated as appropriate:  Tobacco  Allergies  Meds  Problems  Med Hx  Surg Hx  Fam Hx      Review of Systems:  No other skin or systemic complaints except as noted in HPI or Assessment and Plan.  Objective  Well appearing patient in no apparent distress; mood and affect are within normal limits.  A focused examination was performed including face, neck, chest, bilateral arms, bilateral hands, upper back. Relevant physical exam findings are noted in the Assessment and Plan.  Objective  Right Forearm - Anterior: Pink papules   Assessment & Plan  Insect bite of right forearm, initial encounter Right Forearm - Anterior  Favor bite reaction over folliculitis.  Not pruritic.  Exam not suggestive of malignancy today.  Recommend vaseline and bandaid until healed.  Avoid picking. Call for repeat evaluation if not resolved in 4 weeks.  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  Call clinic for new or changing lesions.  Recommend daily use of broad spectrum spf 30+ sunscreen to sun-exposed areas.   Actinic Damage - diffuse scaly erythematous macules with underlying dyspigmentation - Recommend daily broad spectrum sunscreen SPF 30+ to sun-exposed areas, reapply every 2 hours as needed.  - Call for new or changing lesions.  Lentigines - Scattered tan macules - Discussed due to sun exposure - Benign, observe - Call for any changes  Melanocytic Nevi - Tan-brown and/or pink-flesh-colored symmetric macules and papules - Benign appearing on exam  today - Observation - Call clinic for new or changing moles - Recommend daily use of broad spectrum spf 30+ sunscreen to sun-exposed areas.    Return for TBSE.  Graciella Belton, RMA, am acting as scribe for Forest Gleason, MD .  Documentation: I have reviewed the above documentation for accuracy and completeness, and I agree with the above.  Forest Gleason, MD

## 2020-04-10 ENCOUNTER — Encounter: Payer: Self-pay | Admitting: Dermatology

## 2020-08-06 ENCOUNTER — Other Ambulatory Visit: Payer: Self-pay

## 2020-08-06 ENCOUNTER — Encounter: Payer: BC Managed Care – PPO | Admitting: Dermatology

## 2020-10-13 ENCOUNTER — Encounter: Payer: Self-pay | Admitting: Dermatology

## 2020-10-13 ENCOUNTER — Ambulatory Visit: Payer: BC Managed Care – PPO | Admitting: Dermatology

## 2020-10-13 ENCOUNTER — Other Ambulatory Visit: Payer: Self-pay

## 2020-10-13 DIAGNOSIS — L578 Other skin changes due to chronic exposure to nonionizing radiation: Secondary | ICD-10-CM

## 2020-10-13 DIAGNOSIS — Z1501 Genetic susceptibility to malignant neoplasm of breast: Secondary | ICD-10-CM | POA: Diagnosis not present

## 2020-10-13 DIAGNOSIS — L814 Other melanin hyperpigmentation: Secondary | ICD-10-CM

## 2020-10-13 DIAGNOSIS — D229 Melanocytic nevi, unspecified: Secondary | ICD-10-CM

## 2020-10-13 DIAGNOSIS — Z1509 Genetic susceptibility to other malignant neoplasm: Secondary | ICD-10-CM

## 2020-10-13 DIAGNOSIS — D239 Other benign neoplasm of skin, unspecified: Secondary | ICD-10-CM

## 2020-10-13 DIAGNOSIS — D1800 Hemangioma unspecified site: Secondary | ICD-10-CM

## 2020-10-13 DIAGNOSIS — L821 Other seborrheic keratosis: Secondary | ICD-10-CM

## 2020-10-13 DIAGNOSIS — L720 Epidermal cyst: Secondary | ICD-10-CM

## 2020-10-13 DIAGNOSIS — D492 Neoplasm of unspecified behavior of bone, soft tissue, and skin: Secondary | ICD-10-CM

## 2020-10-13 DIAGNOSIS — D2372 Other benign neoplasm of skin of left lower limb, including hip: Secondary | ICD-10-CM

## 2020-10-13 DIAGNOSIS — Z1502 Genetic susceptibility to malignant neoplasm of ovary: Secondary | ICD-10-CM

## 2020-10-13 DIAGNOSIS — Z1283 Encounter for screening for malignant neoplasm of skin: Secondary | ICD-10-CM

## 2020-10-13 DIAGNOSIS — Z808 Family history of malignant neoplasm of other organs or systems: Secondary | ICD-10-CM

## 2020-10-13 NOTE — Progress Notes (Signed)
Follow-Up Visit   Subjective  Judith Preston is a 45 y.o. female who presents for the following: Annual Exam (Mole check, ). Pt c/o spot on the R side of her nose hx of bleeding. Fx hx of skin cancer.  Patient here for full body skin exam and skin cancer screening.  The following portions of the chart were reviewed this encounter and updated as appropriate:   Tobacco  Allergies  Meds  Problems  Med Hx  Surg Hx  Fam Hx      Review of Systems:  No other skin or systemic complaints except as noted in HPI or Assessment and Plan.  Objective  Well appearing patient in no apparent distress; mood and affect are within normal limits.  A full examination was performed including scalp, head, eyes, ears, nose, lips, neck, chest, axillae, abdomen, back, buttocks, bilateral upper extremities, bilateral lower extremities, hands, feet, fingers, toes, fingernails, and toenails. All findings within normal limits unless otherwise noted below.  Objective  Left Thigh lateral: Firm pink/brown papulenodule with dimple sign.   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  Call clinic for new or changing lesions.  Recommend daily use of broad spectrum spf 30+ sunscreen to sun-exposed areas.    Neoplasm of skin R ala crease  Skin / nail biopsy Type of biopsy: tangential   Informed consent: discussed and consent obtained   Patient was prepped and draped in usual sterile fashion: area prepped with alochol. Anesthesia: the lesion was anesthetized in a standard fashion   Anesthetic:  1% lidocaine w/ epinephrine 1-100,000 buffered w/ 8.4% NaHCO3 Instrument used: flexible razor blade    Hemostasis achieved with: pressure, aluminum chloride and electrodesiccation   Outcome: patient tolerated procedure well   Post-procedure details: wound care instructions given   Post-procedure details comment:  Ointment and small bandage  Specimen 1 - Surgical pathology Differential Diagnosis: R/O BCC vs Angiofibroma   Check Margins: No 0.3 cm pink papule  R/O BCC vs Angiofibroma    Lentigines - Scattered tan macules - Due to sun exposure - Benign-appering, observe - Recommend daily broad spectrum sunscreen SPF 30+ to sun-exposed areas, reapply every 2 hours as needed. - Call for any changes  Seborrheic Keratoses - Stuck-on, waxy, tan-brown papules and plaques  - Discussed benign etiology and prognosis. - Observe - Call for any changes  Melanocytic Nevi - Tan-brown and/or pink-flesh-colored symmetric macules and papules - Benign appearing on exam today - Observation - Call clinic for new or changing moles - Recommend daily use of broad spectrum spf 30+ sunscreen to sun-exposed areas.   Hemangiomas - Red papules - Discussed benign nature - Observe - Call for any changes  Actinic Damage - Chronic, secondary to cumulative UV/sun exposure - diffuse scaly erythematous macules with underlying dyspigmentation - Recommend daily broad spectrum sunscreen SPF 30+ to sun-exposed areas, reapply every 2 hours as needed.  - Call for new or changing lesions.  Skin cancer screening performed today.  Return in about 1 year (around 10/13/2021) for TBSE.  I, Judith Preston, CMA, am acting as scribe for Forest Gleason, MD .  Documentation: I have reviewed the above documentation for accuracy  and completeness, and I agree with the above.  Forest Gleason, MD

## 2020-10-13 NOTE — Patient Instructions (Addendum)
Melanoma ABCDEs  Melanoma is the most dangerous type of skin cancer, and is the leading cause of death from skin disease.  You are more likely to develop melanoma if you:  Have light-colored skin, light-colored eyes, or red or blond hair  Spend a lot of time in the sun  Tan regularly, either outdoors or in a tanning bed  Have had blistering sunburns, especially during childhood  Have a close family member who has had a melanoma  Have atypical moles or large birthmarks  Early detection of melanoma is key since treatment is typically straightforward and cure rates are extremely high if we catch it early.   The first sign of melanoma is often a change in a mole or a new dark spot.  The ABCDE system is a way of remembering the signs of melanoma.  A for asymmetry:  The two halves do not match. B for border:  The edges of the growth are irregular. C for color:  A mixture of colors are present instead of an even brown color. D for diameter:  Melanomas are usually (but not always) greater than 6mm - the size of a pencil eraser. E for evolution:  The spot keeps changing in size, shape, and color.  Please check your skin once per month between visits. You can use a small mirror in front and a large mirror behind you to keep an eye on the back side or your body.   If you see any new or changing lesions before your next follow-up, please call to schedule a visit.  Please continue daily skin protection including broad spectrum sunscreen SPF 30+ to sun-exposed areas, reapplying every 2 hours as needed when you're outdoors.   Wound Care Instructions  1. Cleanse wound gently with soap and water once a day then pat dry with clean gauze. Apply a thing coat of Petrolatum (petroleum jelly, "Vaseline") over the wound (unless you have an allergy to this). We recommend that you use a new, sterile tube of Vaseline. Do not pick or remove scabs. Do not remove the yellow or white "healing tissue" from the  base of the wound.  2. Cover the wound with fresh, clean, nonstick gauze and secure with paper tape. You may use Band-Aids in place of gauze and tape if the would is small enough, but would recommend trimming much of the tape off as there is often too much. Sometimes Band-Aids can irritate the skin.  3. You should call the office for your biopsy report after 1 week if you have not already been contacted.  4. If you experience any problems, such as abnormal amounts of bleeding, swelling, significant bruising, significant pain, or evidence of infection, please call the office immediately.  5. FOR ADULT SURGERY PATIENTS: If you need something for pain relief you may take 1 extra strength Tylenol (acetaminophen) AND 2 Ibuprofen (200mg each) together every 4 hours as needed for pain. (do not take these if you are allergic to them or if you have a reason you should not take them.) Typically, you may only need pain medication for 1 to 3 days.      

## 2020-10-19 ENCOUNTER — Telehealth: Payer: Self-pay

## 2020-10-19 NOTE — Telephone Encounter (Signed)
Patient was called regarding biopsy on right ala crease and informed of results. She verbalized understanding and denied further questions at this time.

## 2020-10-19 NOTE — Telephone Encounter (Signed)
-----   Message from [place], MD sent at 10/16/2020  3:52 PM EDT ----- Skin , right ala crease, shave MILIUM  Benign tiny cyst, no evidence of cancer.  MAs please call. Thank you!

## 2020-11-08 ENCOUNTER — Emergency Department (HOSPITAL_COMMUNITY): Payer: BC Managed Care – PPO

## 2020-11-08 ENCOUNTER — Encounter (HOSPITAL_COMMUNITY): Payer: Self-pay

## 2020-11-08 ENCOUNTER — Emergency Department (HOSPITAL_COMMUNITY)
Admission: EM | Admit: 2020-11-08 | Discharge: 2020-11-08 | Disposition: A | Discharge status: Home / Self Care | Payer: BC Managed Care – PPO | Attending: Emergency Medicine | Admitting: Emergency Medicine

## 2020-11-08 ENCOUNTER — Other Ambulatory Visit: Payer: Self-pay

## 2020-11-08 DIAGNOSIS — R091 Pleurisy: Secondary | ICD-10-CM | POA: Insufficient documentation

## 2020-11-08 DIAGNOSIS — Z20822 Contact with and (suspected) exposure to covid-19: Secondary | ICD-10-CM | POA: Diagnosis not present

## 2020-11-08 DIAGNOSIS — J189 Pneumonia, unspecified organism: Secondary | ICD-10-CM

## 2020-11-08 LAB — CBC WITH DIFFERENTIAL/PLATELET
Abs Immature Granulocytes: 0.53 10*3/uL — ABNORMAL HIGH (ref 0.00–0.07)
Basophils Absolute: 0.1 10*3/uL (ref 0.0–0.1)
Basophils Relative: 1 %
Eosinophils Absolute: 0 10*3/uL (ref 0.0–0.5)
Eosinophils Relative: 0 %
HCT: 38.8 % (ref 36.0–46.0)
Hemoglobin: 12.6 g/dL (ref 12.0–15.0)
Immature Granulocytes: 6 %
Lymphocytes Relative: 26 %
Lymphs Abs: 2.4 10*3/uL (ref 0.7–4.0)
MCH: 31.7 pg (ref 26.0–34.0)
MCHC: 32.5 g/dL (ref 30.0–36.0)
MCV: 97.5 fL (ref 80.0–100.0)
Monocytes Absolute: 0.9 10*3/uL (ref 0.1–1.0)
Monocytes Relative: 10 %
Neutro Abs: 5.4 10*3/uL (ref 1.7–7.7)
Neutrophils Relative %: 57 %
Platelets: 451 10*3/uL — ABNORMAL HIGH (ref 150–400)
RBC: 3.98 MIL/uL (ref 3.87–5.11)
RDW: 12.3 % (ref 11.5–15.5)
WBC: 9.3 10*3/uL (ref 4.0–10.5)
nRBC: 0 % (ref 0.0–0.2)

## 2020-11-08 LAB — BASIC METABOLIC PANEL
Anion gap: 8 (ref 5–15)
BUN: 10 mg/dL (ref 6–20)
CO2: 28 mmol/L (ref 22–32)
Calcium: 10.6 mg/dL — ABNORMAL HIGH (ref 8.9–10.3)
Chloride: 104 mmol/L (ref 98–111)
Creatinine, Ser: 0.83 mg/dL (ref 0.44–1.00)
GFR, Estimated: >60 mL/min (ref >60)
Glucose, Bld: 118 mg/dL — ABNORMAL HIGH (ref 70–99)
Potassium: 3.4 mmol/L — ABNORMAL LOW (ref 3.5–5.1)
Sodium: 140 mmol/L (ref 135–145)

## 2020-11-08 LAB — RESP PANEL BY RT-PCR (FLU A&B, COVID) ARPGX2
Influenza A by PCR: NEGATIVE
Influenza B by PCR: NEGATIVE
SARS Coronavirus 2 by RT PCR: NEGATIVE

## 2020-11-08 LAB — I-STAT BETA HCG BLOOD, ED (MC, WL, AP ONLY): I-stat hCG, quantitative: <5 m[IU]/mL (ref <5)

## 2020-11-08 LAB — D-DIMER, QUANTITATIVE: D-Dimer, Quant: 0.87 ug/mL-FEU — ABNORMAL HIGH (ref 0.00–0.50)

## 2020-11-08 MED ORDER — IOHEXOL 350 MG/ML SOLN: 65.0000 mL | Freq: Once | INTRAVENOUS | Status: DC | PRN

## 2020-11-08 MED ORDER — IOHEXOL 350 MG/ML SOLN
65.0000 mL | Freq: Once | INTRAVENOUS | Status: AC | PRN
  Administered 2020-11-08: 65 mL via INTRAVENOUS

## 2020-11-08 NOTE — ED Provider Notes (Signed)
Patient signed out to me awaiting CT scan of her chest to further evaluate for infection/PE.  Currently on doxycycline for the last several days for no pneumonia.  Had influenza recently.  Suspect postinfectious pneumonia.  CT scan negative for PE.  Some mild pneumonia in the right lungs.  No evidence to suggest sepsis on exam or work-up.  Overall she appears well.  She is about 3 or 4 days into antibiotics.  She understands return precautions.  Discharged in good condition.  Overall suspect improving pneumonia.  This chart was dictated using voice recognition software.  Despite best efforts to proofread,  errors can occur which can change the documentation meaning.     Lennice Sites, DO 11/08/20 [id number]

## 2020-11-08 NOTE — Discharge Instructions (Addendum)
Continue antibiotics.  Follow-up with your primary care doctor.

## 2020-11-08 NOTE — ED Triage Notes (Addendum)
Patient diagnosed with pneumonia this past Wednesday and started on Doxycycline and prednisone. Complains of sharp right chest pain/ rib pain with any inspiration. Dry cough present. Hx of pneumonia. Patient reports that she feels no better

## 2020-11-08 NOTE — ED Provider Notes (Signed)
[facility] EMERGENCY DEPARTMENT Provider Note   CSN: [id number] Arrival date & time: 11/08/20  1315     History No chief complaint on file.   Judith Preston is a 45 y.o. female.  The history is provided by the patient and medical records. No language interpreter was used.     45 year old female significant family history of breast cancer and ovarian cancer and prior history of pneumonia presenting with complaints of pleuritic chest pain.  Patient states several weeks ago she developed cold symptoms and did test positive for influenza A.  She report symptoms Progressively worse prompting a visit to her PCP 4 days ago.  States she had a chest x-ray that confirmed pneumonia and she was prescribed prednisone and doxycycline.  She has been taking the medication and noticed some improvement of her symptoms however she noticed some pleuritic chest pain on the right side worsened today prompting this ER visit.  She however denies having fever chills no worsening shortness of breath lightheadedness dizziness or palpitation.  No loss of taste or smell.  No hemoptysis.  She denies any leg pain or calf swelling.  She did report that her grandmother did die from a blood clot but that was after surgery.  She is BRCA positive and has had mastectomy and hysterectomy in 2019 and currently on Loestrin.  She denies any prior history of PE or DVT no recent surgery or prolonged bedrest active cancer.  No history of shingles, have not noticed any skin rash   Past Medical History:  Diagnosis Date  . 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 Family history of breast cancer    paternal aunt with BRCA2 mutation  . Family history of ovarian cancer   . Genetic testing 10/03/2017   Breast/GYN panel (23 genes) @ [facility] - Pathogenic BRCA2 mutation detected  . Pneumonia    2012 and 2013    Patient Active Problem List   Diagnosis Date Noted   . Genetic testing 10/03/2017  . BRCA2 positive 10/03/2017  . Family history of breast cancer   . Family history of ovarian cancer     Past Surgical History:  Procedure Laterality Date  . BREAST SURGERY     augmentation  . DILATION AND CURETTAGE OF UTERUS    . ORIF CLAVICULAR FRACTURE Left 06/18/2013   Procedure: OPEN REDUCTION INTERNAL FIXATION (ORIF) CLAVICULAR FRACTURE;  Surgeon: Meredith Pel, MD;  Location: [facility];  Service: Orthopedics;  Laterality: Left;  LEFT CLAVICLE FRACTURE FIXATION.     OB History   No obstetric history on file.     Family History  Problem Relation Age of Onset  . Breast cancer Paternal Aunt 72       currently 70  . BRCA 1/2 Paternal Aunt        BRCA2 mutation [id number] [phone])  . Ovarian cancer Paternal Grandmother 11       deceased 67    Social History   Tobacco Use  . Smoking status: Never Smoker  . Smokeless tobacco: Never Used  Substance Use Topics  . Alcohol use: No  . Drug use: No    Home Medications Prior to Admission medications   Medication Sig Start Date End Date Taking? Authorizing Provider  Cholecalciferol (VITAMIN D3 ADULT GUMMIES) 1000 UNITS CHEW Chew 2 tablets by mouth daily.    [provider]  methocarbamol (ROBAXIN) 500 MG tablet Take 1 tablet (500 mg total) by mouth 4 (  four) times daily. 06/18/13   Meredith Pel, MD  norethindrone-ethinyl estradiol-iron (MICROGESTIN FE,GILDESS FE,LOESTRIN FE) 1.5-30 MG-MCG tablet Take 1 tablet by mouth daily.    [provider]  oxyCODONE-acetaminophen (PERCOCET) 10-325 MG per tablet Take 1-2 tablets by mouth every 6 (six) hours as needed for pain. 06/18/13   Meredith Pel, MD    Allergies    Patient has no known allergies.  Review of Systems   Review of Systems  All other systems reviewed and are negative.   Physical Exam Updated Vital Signs BP (!) 140/97 (BP Location: Left Arm)   Pulse 81   Temp 97.9 F (36.6 C) (Oral)    Resp 18   SpO2 98%   Physical Exam Vitals and nursing note reviewed.  Constitutional:      General: She is not in acute distress.    Appearance: She is well-developed.  HENT:     Head: Atraumatic.  Eyes:     Conjunctiva/sclera: Conjunctivae normal.  Cardiovascular:     Rate and Rhythm: Normal rate and regular rhythm.     Pulses: Normal pulses.     Heart sounds: Normal heart sounds.  Pulmonary:     Effort: Pulmonary effort is normal.     Breath sounds: Normal breath sounds.  Abdominal:     General: Abdomen is flat.     Palpations: Abdomen is soft.     Tenderness: There is no abdominal tenderness.  Musculoskeletal:     Cervical back: Neck supple.     Right lower leg: No edema.     Left lower leg: No edema.  Skin:    Findings: No rash.  Neurological:     Mental Status: She is alert and oriented to person, place, and time.  Psychiatric:        Mood and Affect: Mood normal.     ED Results / Procedures / Treatments   Labs (all labs ordered are listed, but only abnormal results are displayed) Labs Reviewed  CBC WITH DIFFERENTIAL/PLATELET  BASIC METABOLIC PANEL  I-STAT BETA HCG BLOOD, ED ([facility], [facility], [facility] ONLY)    EKG None  Radiology No results found.   11/05/2020 8:19 AM EDT  IMPRESSION: Hyperinflation with right middle and right lower lobe infiltrates.  Electronically Signed by: Ruta Hinds on 11/05/2020 8:19 AM    XR Chest Pa And Lateral (11/05/2020 8:12 AM EDT)  Narrative  11/05/2020 8:19 AM EDT  INDICATION: Cough  COMPARISON: 05/03/2017  TECHNIQUE: Chest 2 views.  FINDINGS: Right middle lobe and right lower lobe infiltrates. Hyperinflation. No pleural effusion. Normal heart size. Orthopedic hardware left clavicle. Breast implants.       Procedures Procedures   Medications Ordered in ED Medications - No data to display  ED Course  I have reviewed the triage vital signs and the nursing notes.  Pertinent labs & imaging results that were available  during my care of the patient were reviewed by me and considered in my medical decision making (see chart for details).    MDM Rules/Calculators/A&P                          BP 111/79   Pulse 66   Temp 97.9 F (36.6 C) (Oral)   Resp 17   SpO2 99%   Final Clinical Impression(s) / ED Diagnoses Final diagnoses:  None    Rx / DC Orders ED Discharge Orders    None      2:26  PM Patient previously diagnosed positive for influenza A several weeks ago with residual symptoms who had a chest x-ray 4 days ago that shows pneumonia on the right lung.  She has been taking prednisone and doxycycline but is here today complaining of pleuritic chest pain.  She denies any significant risk factor for PE except for being on Loestrin and has had BRCA positive test in the past.  At this time she is resting comfortably, skin exam unremarkable, lungs are clear to auscultation and she does not have any hypoxia  3:39 PM D-dimer is mildly elevated.  Chest x-ray shows right focal infiltrate slightly worsened from prior.  Covid test is currently pending.  Remainder of the labs are reassuring.  Will order chest CT angiogram to rule out PE.  Patient signed out to oncoming team who will follow up on CT results and will reassess.   Domenic Moras, PA-C 11/08/20 1540    Lacretia Leigh, MD 11/10/20 [phone]

## 2021-10-20 ENCOUNTER — Encounter: Payer: BC Managed Care – PPO | Admitting: Dermatology

## 2022-01-03 ENCOUNTER — Emergency Department (HOSPITAL_COMMUNITY)
Admission: EM | Admit: 2022-01-03 | Discharge: 2022-01-03 | Disposition: A | Discharge status: Home / Self Care | Payer: BC Managed Care – PPO | Attending: Emergency Medicine | Admitting: Emergency Medicine

## 2022-01-03 ENCOUNTER — Encounter (HOSPITAL_COMMUNITY): Payer: Self-pay

## 2022-01-03 ENCOUNTER — Emergency Department (HOSPITAL_COMMUNITY): Payer: BC Managed Care – PPO

## 2022-01-03 DIAGNOSIS — S76012A Strain of muscle, fascia and tendon of left hip, initial encounter: Secondary | ICD-10-CM | POA: Diagnosis not present

## 2022-01-03 DIAGNOSIS — M25551 Pain in right hip: Secondary | ICD-10-CM | POA: Insufficient documentation

## 2022-01-03 DIAGNOSIS — R001 Bradycardia, unspecified: Secondary | ICD-10-CM | POA: Insufficient documentation

## 2022-01-03 DIAGNOSIS — R531 Weakness: Secondary | ICD-10-CM | POA: Diagnosis not present

## 2022-01-03 DIAGNOSIS — Y92410 Unspecified street and highway as the place of occurrence of the external cause: Secondary | ICD-10-CM | POA: Insufficient documentation

## 2022-01-03 DIAGNOSIS — S79912A Unspecified injury of left hip, initial encounter: Secondary | ICD-10-CM | POA: Diagnosis present
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
## 2022-01-03 NOTE — Discharge Instructions (Addendum)
Please use Tylenol or ibuprofen for pain.  You may use 600 mg ibuprofen every 6 hours or 1000 mg of Tylenol every 6 hours.  You may choose to alternate between the 2.  This would be most effective.  Not to exceed 4 g of Tylenol within 24 hours.  Not to exceed 3200 mg ibuprofen 24 hours.  I think that you have a strain of the left hip flexor, there may be some aspect of hip abductor strain as well.  I cannot locate any hip flexor rehab exercises in our attachments, but did find some for abductor muscle strain.  I would recommend trying these rehab exercises, you can also look up some hip flexor exercises online.  Recommend that you follow-up with your orthopedic doctor for further evaluation if you are having ongoing weakness, pain despite treatment for around 2 weeks.  He can apply some ice directly to the affected area for up to 15 minutes at a time.  You have a very slow resting heart rate, you have no evidence of what is called a heart block or other evidence of abnormality.  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  Based on your EKG today I do not have any concern about the normal function of your heart.
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
## 2022-01-05 ENCOUNTER — Encounter: Payer: BC Managed Care – PPO | Admitting: Dermatology

## 2022-02-02 ENCOUNTER — Encounter: Payer: BC Managed Care – PPO | Admitting: Dermatology

## 2022-02-22 ENCOUNTER — Ambulatory Visit: Payer: BC Managed Care – PPO | Admitting: Dermatology

## 2022-02-22 DIAGNOSIS — Z1283 Encounter for screening for malignant neoplasm of skin: Secondary | ICD-10-CM

## 2022-02-22 DIAGNOSIS — L821 Other seborrheic keratosis: Secondary | ICD-10-CM

## 2022-02-22 DIAGNOSIS — Z808 Family history of malignant neoplasm of other organs or systems: Secondary | ICD-10-CM

## 2022-02-22 DIAGNOSIS — L918 Other hypertrophic disorders of the skin: Secondary | ICD-10-CM | POA: Diagnosis not present

## 2022-02-22 DIAGNOSIS — D1800 Hemangioma unspecified site: Secondary | ICD-10-CM

## 2022-02-22 DIAGNOSIS — L578 Other skin changes due to chronic exposure to nonionizing radiation: Secondary | ICD-10-CM | POA: Diagnosis not present

## 2022-02-22 DIAGNOSIS — D229 Melanocytic nevi, unspecified: Secondary | ICD-10-CM

## 2022-02-22 DIAGNOSIS — L814 Other melanin hyperpigmentation: Secondary | ICD-10-CM
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
## 2022-02-22 NOTE — Patient Instructions (Signed)
Recommend taking Heliocare sun protection supplement daily in sunny weather for additional sun protection. For maximum protection on the sunniest days, you can take up to 2 capsules of regular Heliocare OR take 1 capsule of Heliocare Ultra. For prolonged exposure (such as a full day in the sun), you can repeat your dose of the supplement 4 hours after your first dose. Heliocare can be purchased at [facility], at some [facility] or at [email or web address].    Melanoma ABCDEs  Melanoma is the most dangerous type of skin cancer, and is the leading cause of death from skin disease.  You are more likely to develop melanoma if you: Have light-colored skin, light-colored eyes, or red or blond hair Spend a lot of time in the sun Tan regularly, either outdoors or in a tanning bed Have had blistering sunburns, especially during childhood Have a close family member who has had a melanoma Have atypical moles or large birthmarks  Early detection of melanoma is key since treatment is typically straightforward and cure rates are extremely high if we catch it early.   The first sign of melanoma is often a change in a mole or a new dark spot.  The ABCDE system is a way of remembering the signs of melanoma.  A for asymmetry:  The two halves do not match. B for border:  The edges of the growth are irregular. C for color:  A mixture of colors are present instead of an even brown color. D for diameter:  Melanomas are usually (but not always) greater than 6mm - the size of a pencil eraser. E for evolution:  The spot keeps changing in size, shape, and color.  Please check your skin once per month between visits. You can use a small mirror in front and a large mirror behind you to keep an eye on the back side or your body.   If you see any new or changing lesions before your next follow-up, please call to schedule a visit.  Please continue daily skin protection including broad spectrum sunscreen SPF 30+ to  sun-exposed areas, reapplying every 2 hours as needed when you're outdoors.    Due to recent changes in healthcare laws, you may see results of your pathology and/or laboratory studies on MyChart before the doctors have had a chance to review them. We understand that in some cases there may be results that are confusing or concerning to you. Please understand that not all results are received at the same time and often the doctors may need to interpret multiple results in order to provide you with the best plan of care or course of treatment. Therefore, we ask that you please give us 2 business days to thoroughly review all your results before contacting the office for clarification. Should we see a critical lab result, you will be contacted sooner.   If You Need Anything After Your Visit  If you have any questions or concerns for your doctor, please call our main line at [phone] and press option 4 to reach your doctor's medical assistant. If no one answers, please leave a voicemail as directed and we will return your call as soon as possible. Messages left after 4 pm will be answered the following business day.   You may also send us a message via MyChart. We typically respond to MyChart messages within 1-2 business days.  For prescription refills, please ask your pharmacy to contact our office. Our fax number is [phone].  If you have   an urgent issue when the clinic is closed that cannot wait until the next business day, you can page your doctor at the number below.    Please note that while we do our best to be available for urgent issues outside of office hours, we are not available 24/7.   If you have an urgent issue and are unable to reach us, you may choose to seek medical care at your doctor's office, retail clinic, urgent care center, or emergency room.  If you have a medical emergency, please immediately call 911 or go to the emergency department.  Pager Numbers  - Dr.  Kowalski: [phone]  - Dr. Moye: [phone]  - Dr. Stewart: [phone]  In the event of inclement weather, please call our main line at [phone] for an update on the status of any delays or closures.  Dermatology Medication Tips: Please keep the boxes that topical medications come in in order to help keep track of the instructions about where and how to use these. Pharmacies typically print the medication instructions only on the boxes and not directly on the medication tubes.   If your medication is too expensive, please contact our office at [phone] option 4 or send us a message through MyChart.   We are unable to tell what your co-pay for medications will be in advance as this is different depending on your insurance coverage. However, we may be able to find a substitute medication at lower cost or fill out paperwork to get insurance to cover a needed medication.   If a prior authorization is required to get your medication covered by your insurance company, please allow us 1-2 business days to complete this process.  Drug prices often vary depending on where the prescription is filled and some pharmacies may offer cheaper prices.  The website www.goodrx.com contains coupons for medications through different pharmacies. The prices here do not account for what the cost may be with help from insurance (it may be cheaper with your insurance), but the website can give you the price if you did not use any insurance.  - You can print the associated coupon and take it with your prescription to the pharmacy.  - You may also stop by our office during regular business hours and pick up a GoodRx coupon card.  - If you need your prescription sent electronically to a different pharmacy, notify our office through [facility] MyChart or by phone at [phone] option 4.     Si Usted Necesita Algo Despus de Su Visita  Tambin puede enviarnos un mensaje a travs de MyChart. Por lo  general respondemos a los mensajes de MyChart en el transcurso de 1 a 2 das hbiles.  Para renovar recetas, por favor pida a su farmacia que se ponga en contacto con nuestra oficina. Nuestro nmero de fax es el [phone].  Si tiene un asunto urgente cuando la clnica est cerrada y que no puede esperar hasta el siguiente da hbil, puede llamar/localizar a su doctor(a) al nmero que aparece a continuacin.   Por favor, tenga en cuenta que aunque hacemos todo lo posible para estar disponibles para asuntos urgentes fuera del horario de oficina, no estamos disponibles las 24 horas del da, los 7 das de la semana.   Si tiene un problema urgente y no puede comunicarse con nosotros, puede optar por buscar atencin mdica  en el consultorio de su doctor(a), en una clnica privada, en un centro de atencin urgente o en una sala de   emergencias.  Si tiene una emergencia mdica, por favor llame inmediatamente al 911 o vaya a la sala de emergencias.  Nmeros de bper  - Dr. Kowalski: [phone]  - Dra. Moye: [phone]  - Dra. Stewart: [phone]  En caso de inclemencias del tiempo, por favor llame a nuestra lnea principal al [phone] para una actualizacin sobre el estado de cualquier retraso o cierre.  Consejos para la medicacin en dermatologa: Por favor, guarde las cajas en las que vienen los medicamentos de uso tpico para ayudarle a seguir las instrucciones sobre dnde y cmo usarlos. Las farmacias generalmente imprimen las instrucciones del medicamento slo en las cajas y no directamente en los tubos del medicamento.   Si su medicamento es muy caro, por favor, pngase en contacto con nuestra oficina llamando al [phone] y presione la opcin 4 o envenos un mensaje a travs de MyChart.   No podemos decirle cul ser su copago por los medicamentos por adelantado ya que esto es diferente dependiendo de la cobertura de su seguro. Sin embargo, es posible que podamos encontrar un  medicamento sustituto a menor costo o llenar un formulario para que el seguro cubra el medicamento que se considera necesario.   Si se requiere una autorizacin previa para que su compaa de seguros cubra su medicamento, por favor permtanos de 1 a 2 das hbiles para completar este proceso.  Los precios de los medicamentos varan con frecuencia dependiendo del lugar de dnde se surte la receta y alguna farmacias pueden ofrecer precios ms baratos.  El sitio web www.goodrx.com tiene cupones para medicamentos de diferentes farmacias. Los precios aqu no tienen en cuenta lo que podra costar con la ayuda del seguro (puede ser ms barato con su seguro), pero el sitio web puede darle el precio si no utiliz ningn seguro.  - Puede imprimir el cupn correspondiente y llevarlo con su receta a la farmacia.  - Tambin puede pasar por nuestra oficina durante el horario de atencin regular y recoger una tarjeta de cupones de GoodRx.  - Si necesita que su receta se enve electrnicamente a una farmacia diferente, informe a nuestra oficina a travs de MyChart de [facility] o por telfono llamando al [phone] y presione la opcin 4.  

## 2022-02-23 ENCOUNTER — Encounter: Payer: Self-pay | Admitting: Dermatology

## 2022-05-31 ENCOUNTER — Ambulatory Visit: Payer: BC Managed Care – PPO | Admitting: Dermatology

## 2022-12-27 IMAGING — DX DG CHEST 2V
2 series · 2 of 2 positions shown · non-contrast
Comparison: CT chest 06/06/2013. None. Eighty limited chest CT from
06/06/2013 is not helpful.

CLINICAL DATA: Recheck of pneumonia. On steroids. Right chest rib
pain. Dry cough.

EXAM:
CHEST - 2 VIEW

[chest pa]
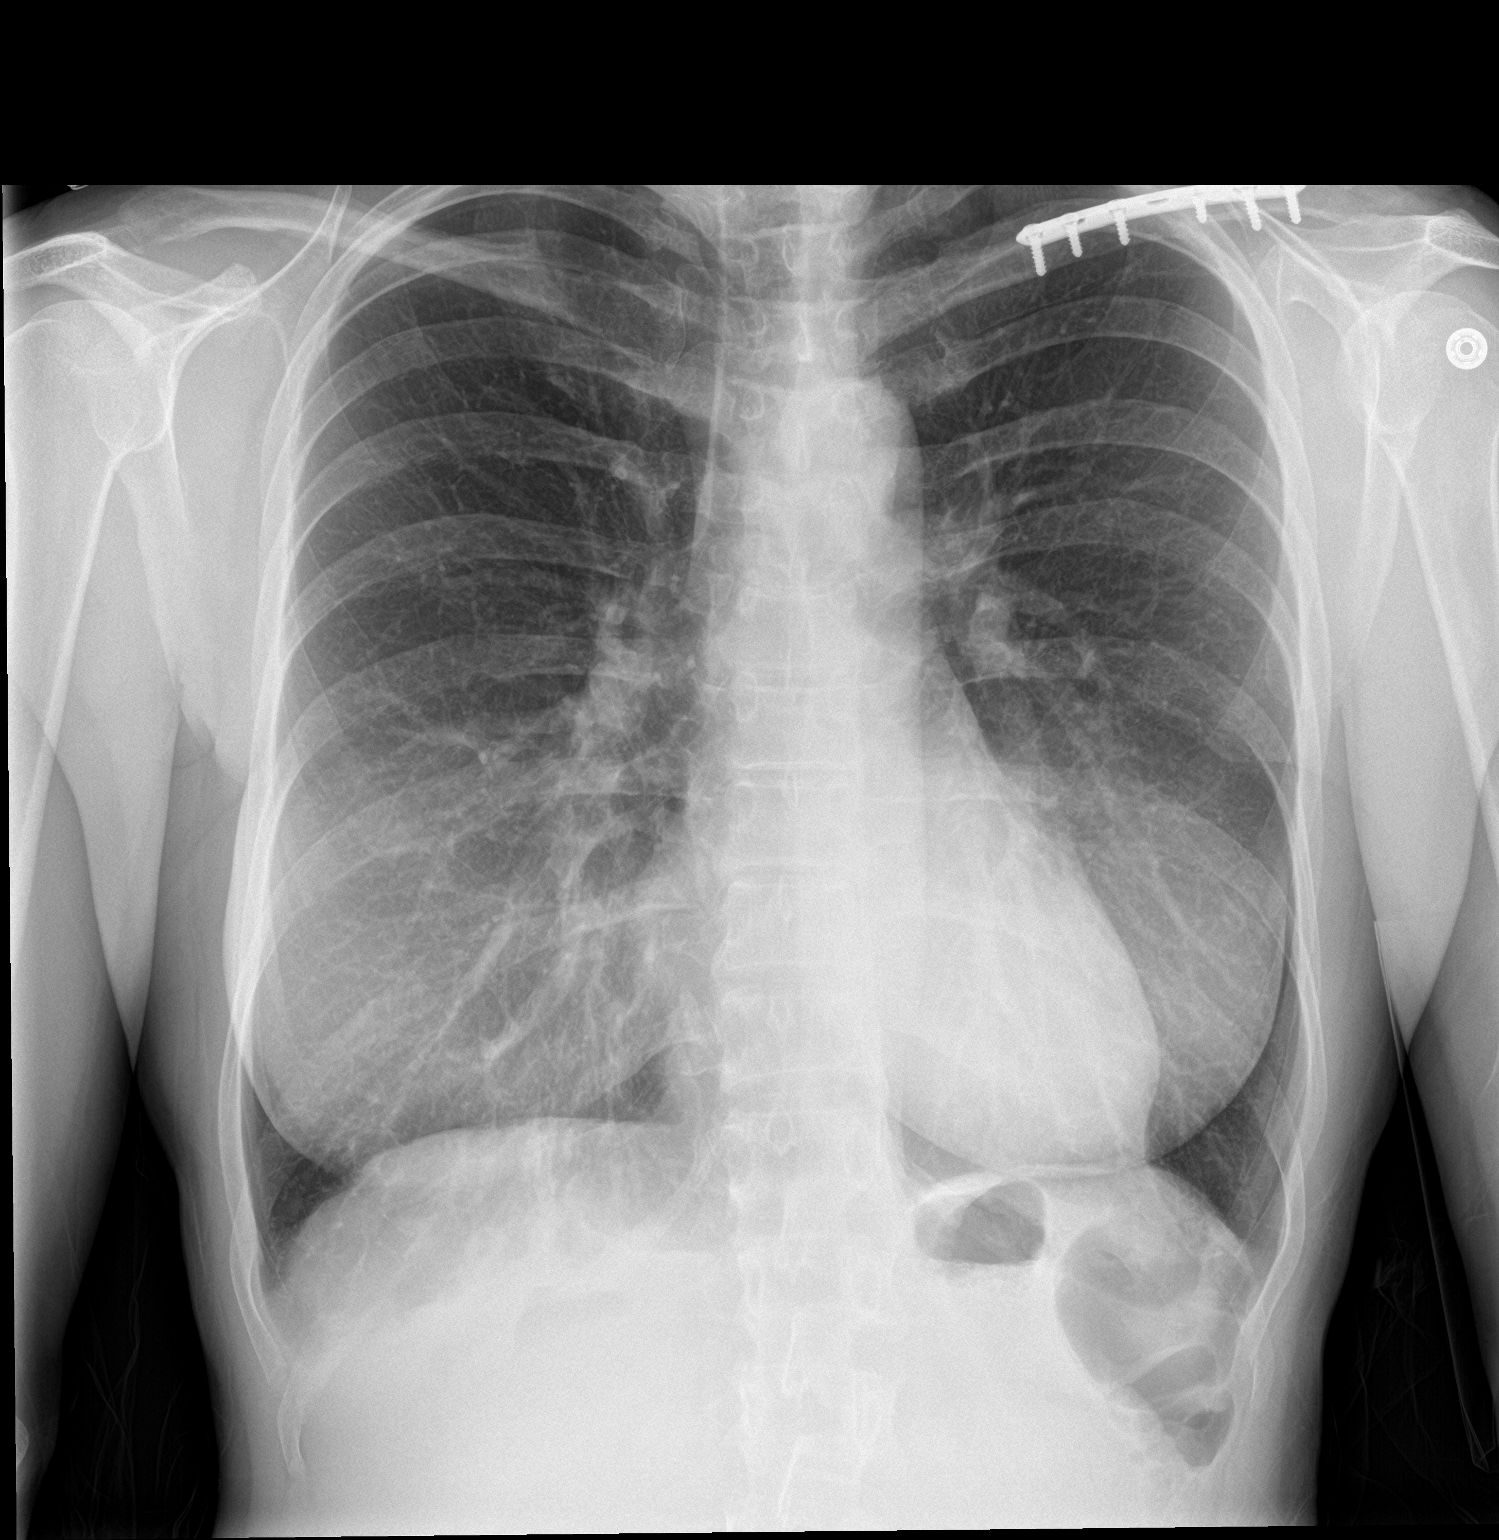

[chest lat]
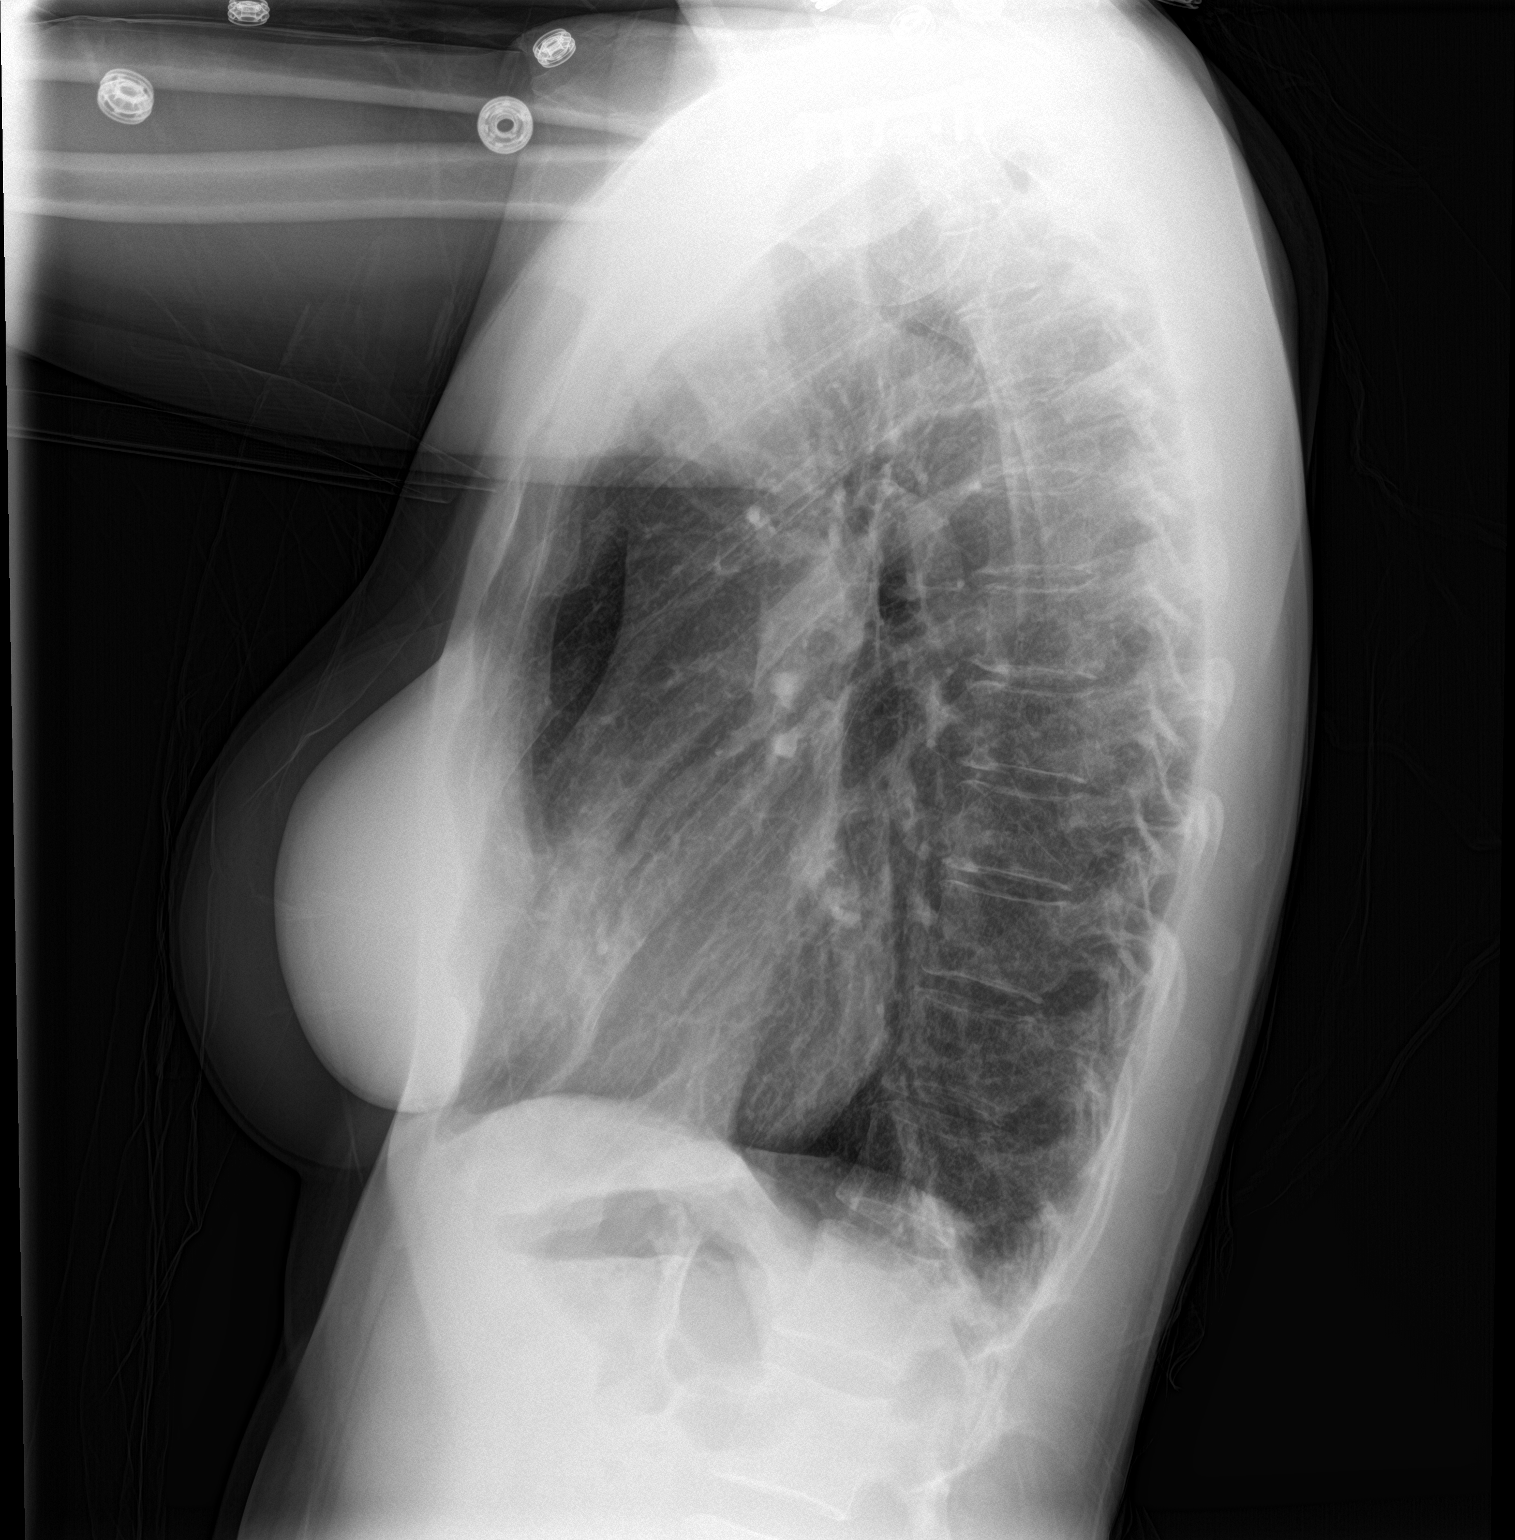

[2 of 2 positions shown; findings below may reference images not displayed]

FINDINGS: Mild pectus excavatum deformity. Lateral view degraded by patient
arm position. Midline trachea. Normal heart size and mediastinal
contours. No pleural effusion or pneumothorax. Bilateral breast
implants, causing increased density over the lower lungs on the
frontal radiograph. On the lateral view, there is anterior increased
density which is favored to be in the right middle lobe on the
frontal radiograph. Minimal S-shaped thoracic spine curvature. Left
clavicular fixation.
IMPRESSION: Anterior increased density on the lateral view, most likely in the
right middle lobe on the frontal radiograph. Favor infection versus
less likely the sequelae of chronic atypical infection (i.e.
Bronchiectasis and mucous plugging).

## 2022-12-27 IMAGING — CT CT ANGIO CHEST
2 of 6 series · 19 of 36 positions shown · IV contrast (omnipaque)
Comparison: None.

CLINICAL DATA: 44 y/o female Patient diagnosed with pneumonia this
past [REDACTED] and started on Doxycycline and prednisone. Complains
of sharp right chest pain/ rib pain with any inspiration.

EXAM:
CT ANGIOGRAPHY CHEST WITH CONTRAST
TECHNIQUE: Multidetector CT imaging of the chest was performed using the
standard protocol during bolus administration of intravenous
contrast. Multiplanar CT image reconstructions and MIPs were
obtained to evaluate the vascular anatomy.
CONTRAST:  65mL OMNIPAQUE IOHEXOL 350 MG/ML SOLN, <See Chart>
OMNIPAQUE IOHEXOL 350 MG/ML SOLN

[Series 7: pe thins · axial · 0.74mm/px · z∈[+1172,+1424]mm · 18 of 403 slices shown]
[im 21/403  lung]
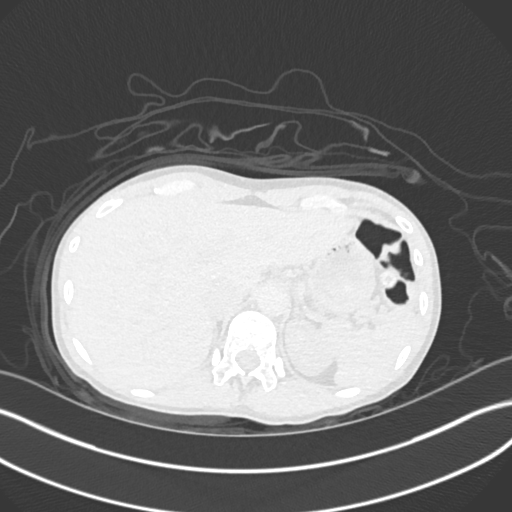
[im 41/403  mediastinal]
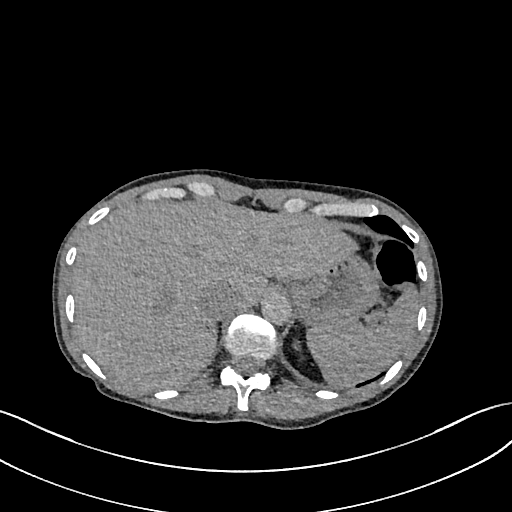
[im 61/403  lung]
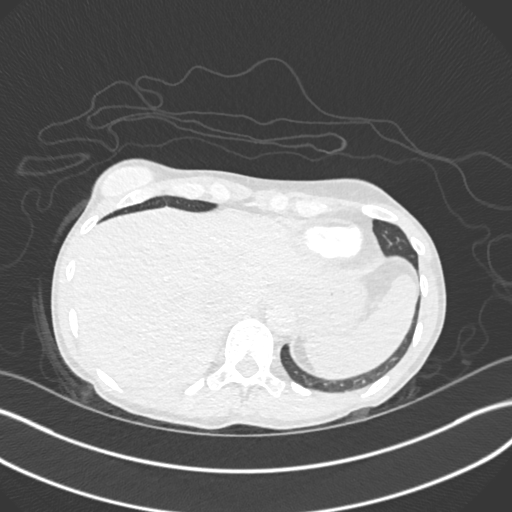
[im 81/403  mediastinal]
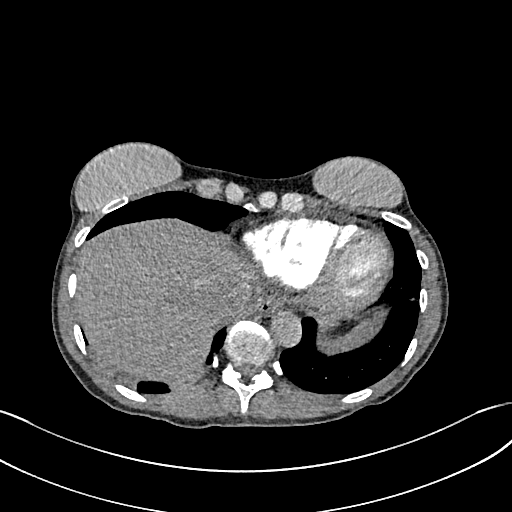
[im 101/403  lung]
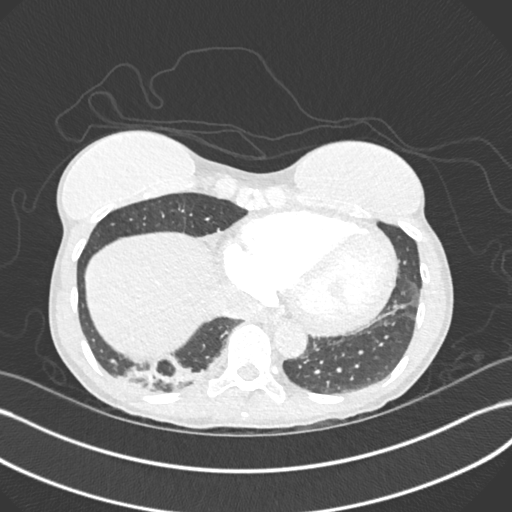
[im 121/403  mediastinal]
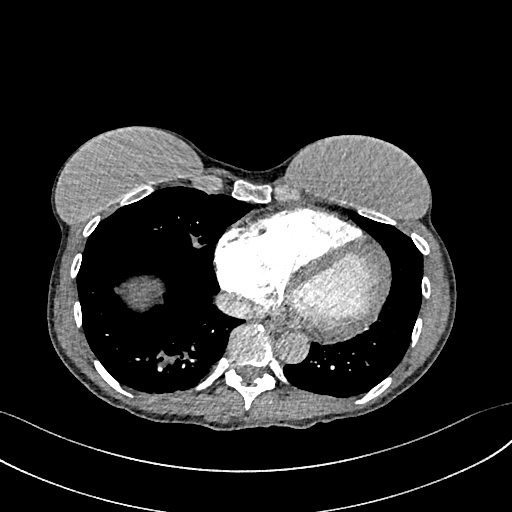
[im 141/403  lung]
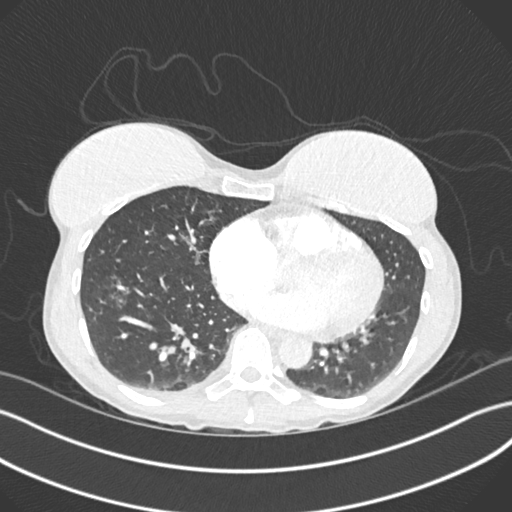
[im 161/403  mediastinal]
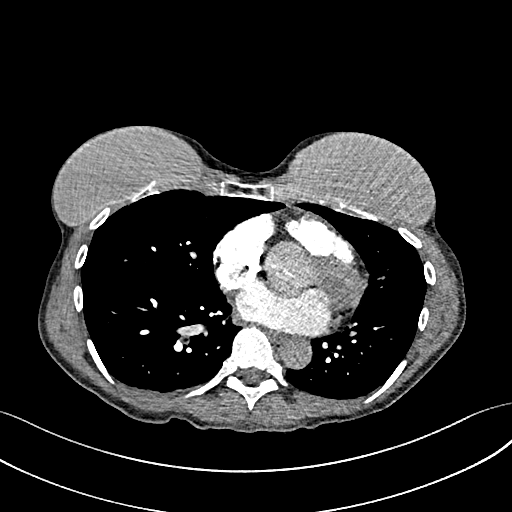
[im 181/403  lung]
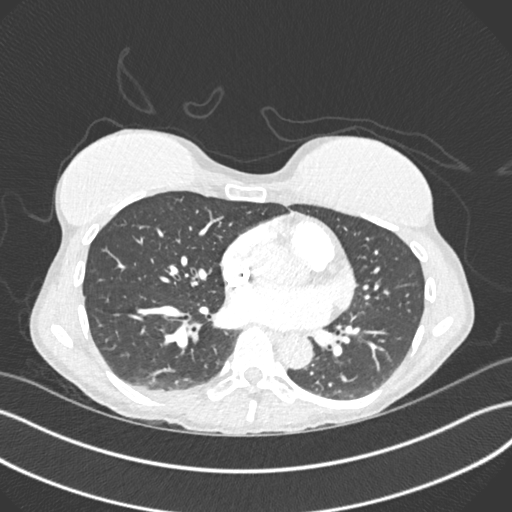
[im 222/403  mediastinal]
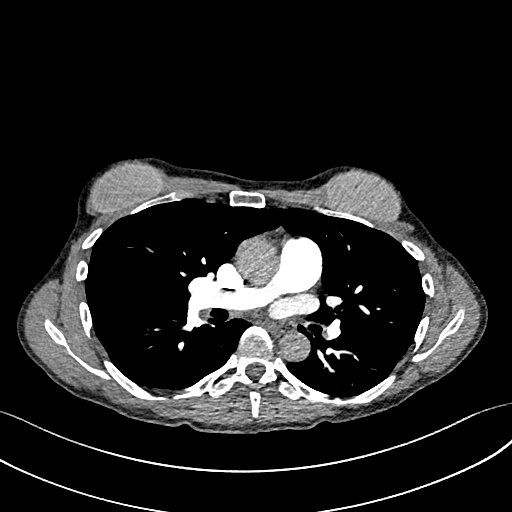
[im 242/403  lung]
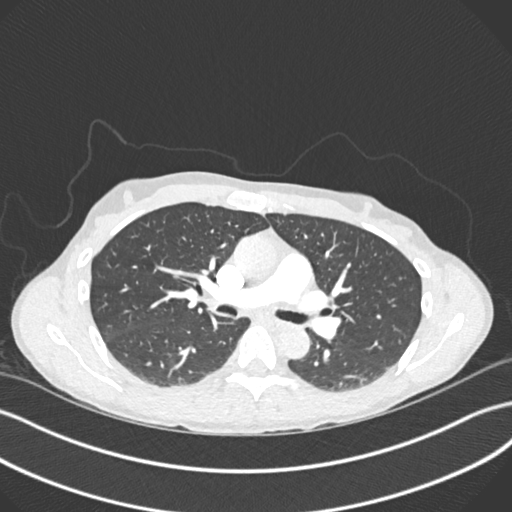
[im 262/403  mediastinal]
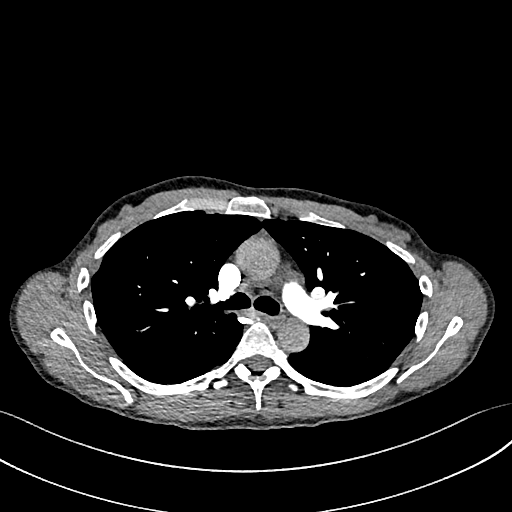
[im 282/403  lung]
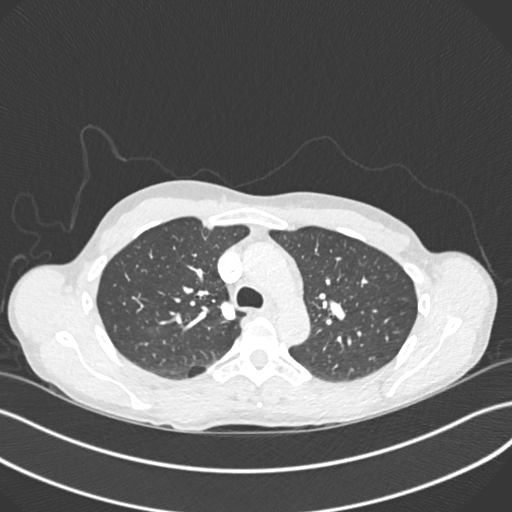
[im 302/403  mediastinal]
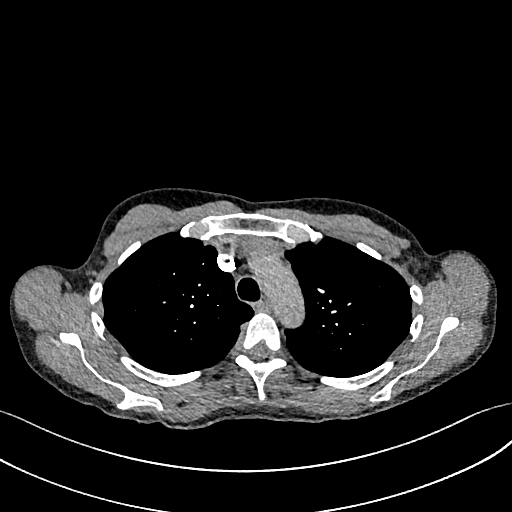
[im 322/403  lung]
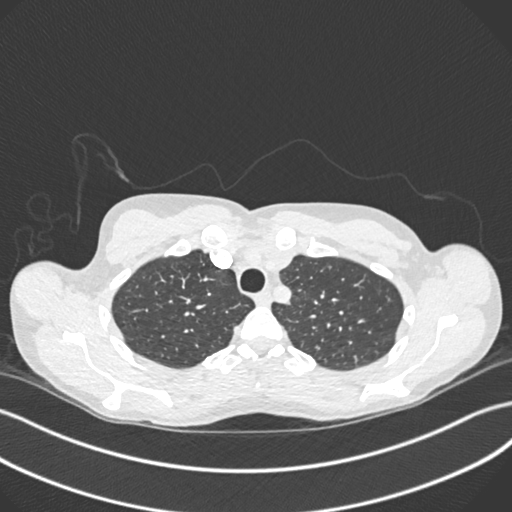
[im 342/403  mediastinal]
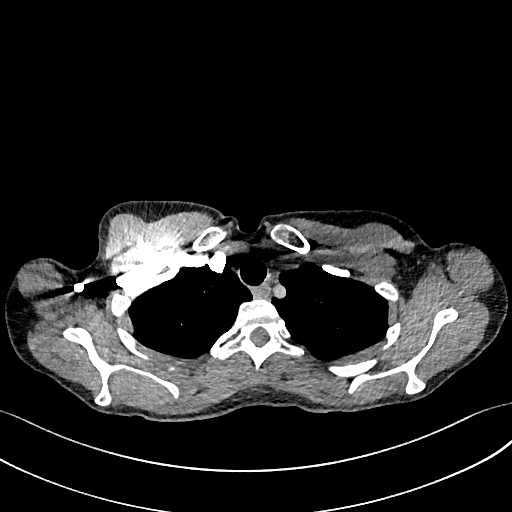
[im 362/403  lung]
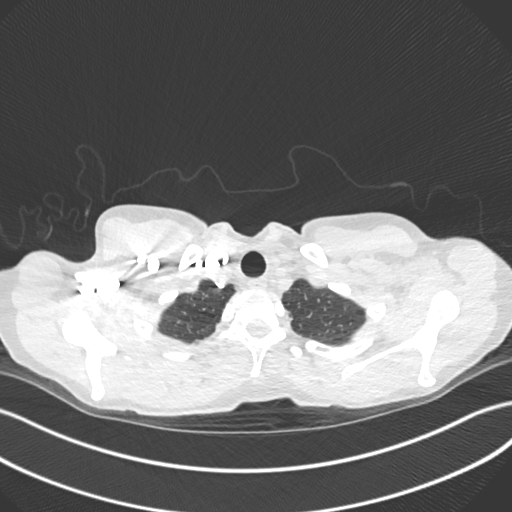
[im 382/403  mediastinal]
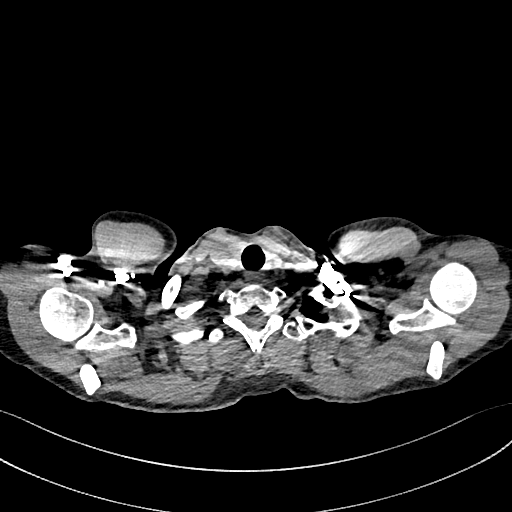

[Series 8: pe 2mm cor · coronal · 0.59mm/px · 1 of 140 slices shown]
[im 70/140  mediastinal]
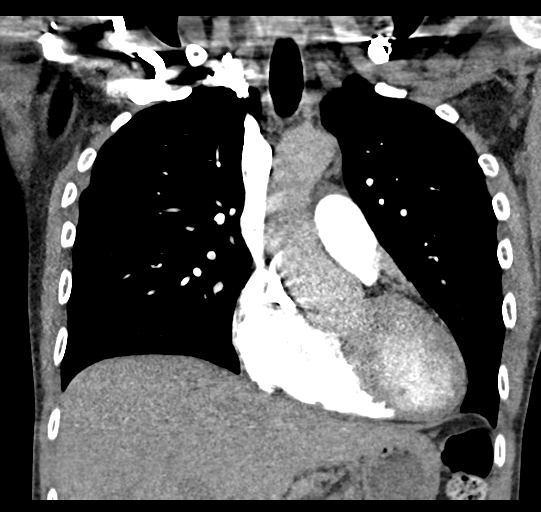

[19 of 36 positions shown; findings below may reference images not displayed]

FINDINGS: Cardiovascular: Satisfactory opacification of the pulmonary arteries
to the segmental level. No evidence of pulmonary embolism. Normal
heart size. No pericardial effusion.

Mediastinum/Nodes: No enlarged mediastinal, hilar, or axillary lymph
nodes. Thyroid gland, trachea, and esophagus demonstrate no
significant findings.

Lungs/Pleura: Central airways are patent. There are mild infiltrates
in the right lung base concerning for infection. A few additional
small opacities in the right lower lung and right middle lobe may
reflect additional foci of infection or atelectasis. Minimal
opacities likely reflecting atelectasis in the left lung. No
pneumothorax or pleural effusion.

Upper Abdomen: No acute abnormality.

Musculoskeletal: No chest wall abnormality. Bilateral breast
implants. No acute or significant osseous findings.

Review of the MIP images confirms the above findings.
IMPRESSION: 1. No evidence of pulmonary embolus.
2. Mild infiltrates in the right lung base and bronchial wall
thickening most likely representing infection. A few additional
small opacities in the right lower lung and right middle lobe may
reflect additional foci of infection or atelectasis.
3. Mild left basilar atelectasis.

## 2023-03-09 ENCOUNTER — Encounter: Payer: BC Managed Care – PPO | Admitting: Dermatology

## 2024-01-02 ENCOUNTER — Encounter: Payer: Self-pay | Admitting: Genetic Counselor
